# Patient Record
Sex: Female | Born: 1965 | Race: White | Hispanic: No | Marital: Married | Smoking: Never smoker
Health system: Southern US, Community
[De-identification: ages and names within clinical notes are randomized; demographics above are authoritative.]

## PROBLEM LIST (undated history)

## (undated) ENCOUNTER — Other Ambulatory Visit (HOSPITAL_COMMUNITY): Admission: EM | Payer: Self-pay

## (undated) DIAGNOSIS — F32A Depression, unspecified: Secondary | ICD-10-CM

## (undated) DIAGNOSIS — F419 Anxiety disorder, unspecified: Secondary | ICD-10-CM

## (undated) DIAGNOSIS — I10 Essential (primary) hypertension: Secondary | ICD-10-CM

## (undated) DIAGNOSIS — E039 Hypothyroidism, unspecified: Secondary | ICD-10-CM

## (undated) DIAGNOSIS — K219 Gastro-esophageal reflux disease without esophagitis: Secondary | ICD-10-CM

---

## 2021-04-07 ENCOUNTER — Encounter (HOSPITAL_COMMUNITY): Payer: Self-pay | Admitting: Emergency Medicine

## 2021-04-07 ENCOUNTER — Other Ambulatory Visit: Payer: Self-pay

## 2021-04-07 ENCOUNTER — Inpatient Hospital Stay (HOSPITAL_COMMUNITY)
Admission: EM | Admit: 2021-04-07 | Discharge: 2021-04-10 | DRG: 918 | Disposition: A | Discharge status: Other Acute Inpatient Hospital | Payer: BC Managed Care – PPO | Attending: Internal Medicine | Admitting: Internal Medicine

## 2021-04-07 ENCOUNTER — Ambulatory Visit (HOSPITAL_COMMUNITY)
Admission: RE | Admit: 2021-04-07 | Discharge: 2021-04-07 | Disposition: A | Discharge status: Home / Self Care | Payer: BC Managed Care – PPO | Attending: Psychiatry | Admitting: Psychiatry

## 2021-04-07 DIAGNOSIS — E039 Hypothyroidism, unspecified: Secondary | ICD-10-CM | POA: Diagnosis present

## 2021-04-07 DIAGNOSIS — I10 Essential (primary) hypertension: Secondary | ICD-10-CM | POA: Diagnosis present

## 2021-04-07 DIAGNOSIS — F1020 Alcohol dependence, uncomplicated: Secondary | ICD-10-CM

## 2021-04-07 DIAGNOSIS — Z79890 Hormone replacement therapy (postmenopausal): Secondary | ICD-10-CM

## 2021-04-07 DIAGNOSIS — E785 Hyperlipidemia, unspecified: Secondary | ICD-10-CM | POA: Diagnosis present

## 2021-04-07 DIAGNOSIS — T50901A Poisoning by unspecified drugs, medicaments and biological substances, accidental (unintentional), initial encounter: Secondary | ICD-10-CM | POA: Diagnosis present

## 2021-04-07 DIAGNOSIS — K219 Gastro-esophageal reflux disease without esophagitis: Secondary | ICD-10-CM | POA: Diagnosis present

## 2021-04-07 DIAGNOSIS — R45851 Suicidal ideations: Secondary | ICD-10-CM | POA: Diagnosis not present

## 2021-04-07 DIAGNOSIS — F1010 Alcohol abuse, uncomplicated: Secondary | ICD-10-CM | POA: Diagnosis present

## 2021-04-07 DIAGNOSIS — F419 Anxiety disorder, unspecified: Secondary | ICD-10-CM | POA: Diagnosis present

## 2021-04-07 DIAGNOSIS — Z9151 Personal history of suicidal behavior: Secondary | ICD-10-CM

## 2021-04-07 DIAGNOSIS — E876 Hypokalemia: Secondary | ICD-10-CM | POA: Diagnosis present

## 2021-04-07 DIAGNOSIS — F10920 Alcohol use, unspecified with intoxication, uncomplicated: Secondary | ICD-10-CM

## 2021-04-07 DIAGNOSIS — T6592XA Toxic effect of unspecified substance, intentional self-harm, initial encounter: Secondary | ICD-10-CM

## 2021-04-07 DIAGNOSIS — T6591XA Toxic effect of unspecified substance, accidental (unintentional), initial encounter: Secondary | ICD-10-CM

## 2021-04-07 DIAGNOSIS — Z79899 Other long term (current) drug therapy: Secondary | ICD-10-CM

## 2021-04-07 DIAGNOSIS — T502X5A Adverse effect of carbonic-anhydrase inhibitors, benzothiadiazides and other diuretics, initial encounter: Secondary | ICD-10-CM | POA: Diagnosis present

## 2021-04-07 DIAGNOSIS — F10220 Alcohol dependence with intoxication, uncomplicated: Secondary | ICD-10-CM | POA: Diagnosis present

## 2021-04-07 DIAGNOSIS — R9431 Abnormal electrocardiogram [ECG] [EKG]: Secondary | ICD-10-CM

## 2021-04-07 DIAGNOSIS — T39312A Poisoning by propionic acid derivatives, intentional self-harm, initial encounter: Secondary | ICD-10-CM | POA: Diagnosis not present

## 2021-04-07 DIAGNOSIS — F322 Major depressive disorder, single episode, severe without psychotic features: Secondary | ICD-10-CM

## 2021-04-07 DIAGNOSIS — Z20822 Contact with and (suspected) exposure to covid-19: Secondary | ICD-10-CM | POA: Diagnosis present

## 2021-04-07 HISTORY — DX: Essential (primary) hypertension: I10

## 2021-04-07 HISTORY — DX: Gastro-esophageal reflux disease without esophagitis: K21.9

## 2021-04-07 HISTORY — DX: Depression, unspecified: F32.A

## 2021-04-07 HISTORY — DX: Anxiety disorder, unspecified: F41.9

## 2021-04-07 LAB — BASIC METABOLIC PANEL
Anion gap: 12 (ref 5–15)
BUN: 12 mg/dL (ref 6–20)
CO2: 22 mmol/L (ref 22–32)
Calcium: 9.5 mg/dL (ref 8.9–10.3)
Chloride: 106 mmol/L (ref 98–111)
Creatinine, Ser: 0.82 mg/dL (ref 0.44–1.00)
GFR, Estimated: >60 mL/min (ref >60)
Glucose, Bld: 142 mg/dL — ABNORMAL HIGH (ref 70–99)
Potassium: 3 mmol/L — ABNORMAL LOW (ref 3.5–5.1)
Sodium: 140 mmol/L (ref 135–145)

## 2021-04-07 LAB — CBC WITH DIFFERENTIAL/PLATELET
Abs Immature Granulocytes: 0.01 10*3/uL (ref 0.00–0.07)
Basophils Absolute: 0 10*3/uL (ref 0.0–0.1)
Basophils Relative: 1 %
Eosinophils Absolute: 0.1 10*3/uL (ref 0.0–0.5)
Eosinophils Relative: 1 %
HCT: 39 % (ref 36.0–46.0)
Hemoglobin: 13.2 g/dL (ref 12.0–15.0)
Immature Granulocytes: 0 %
Lymphocytes Relative: 40 %
Lymphs Abs: 2.7 10*3/uL (ref 0.7–4.0)
MCH: 33.4 pg (ref 26.0–34.0)
MCHC: 33.8 g/dL (ref 30.0–36.0)
MCV: 98.7 fL (ref 80.0–100.0)
Monocytes Absolute: 0.6 10*3/uL (ref 0.1–1.0)
Monocytes Relative: 9 %
Neutro Abs: 3.4 10*3/uL (ref 1.7–7.7)
Neutrophils Relative %: 49 %
Platelets: 232 10*3/uL (ref 150–400)
RBC: 3.95 MIL/uL (ref 3.87–5.11)
RDW: 12.3 % (ref 11.5–15.5)
WBC: 6.9 10*3/uL (ref 4.0–10.5)
nRBC: 0 % (ref 0.0–0.2)

## 2021-04-07 LAB — COMPREHENSIVE METABOLIC PANEL
ALT: 45 U/L — ABNORMAL HIGH (ref 0–44)
AST: 49 U/L — ABNORMAL HIGH (ref 15–41)
Albumin: 3.6 g/dL (ref 3.5–5.0)
Alkaline Phosphatase: 39 U/L (ref 38–126)
Anion gap: 13 (ref 5–15)
BUN: 13 mg/dL (ref 6–20)
CO2: 23 mmol/L (ref 22–32)
Calcium: 9.5 mg/dL (ref 8.9–10.3)
Chloride: 103 mmol/L (ref 98–111)
Creatinine, Ser: 0.92 mg/dL (ref 0.44–1.00)
GFR, Estimated: >60 mL/min (ref >60)
Glucose, Bld: 84 mg/dL (ref 70–99)
Potassium: 2.8 mmol/L — ABNORMAL LOW (ref 3.5–5.1)
Sodium: 139 mmol/L (ref 135–145)
Total Bilirubin: 0.6 mg/dL (ref 0.3–1.2)
Total Protein: 6.2 g/dL — ABNORMAL LOW (ref 6.5–8.1)

## 2021-04-07 LAB — RAPID URINE DRUG SCREEN, HOSP PERFORMED
Amphetamines: NOT DETECTED
Barbiturates: POSITIVE — AB
Benzodiazepines: NOT DETECTED
Cocaine: NOT DETECTED
Opiates: NOT DETECTED
Tetrahydrocannabinol: NOT DETECTED

## 2021-04-07 LAB — ETHANOL: Alcohol, Ethyl (B): 184 mg/dL — ABNORMAL HIGH (ref <10)

## 2021-04-07 LAB — SALICYLATE LEVEL: Salicylate Lvl: <7 mg/dL — ABNORMAL LOW (ref 7.0–30.0)

## 2021-04-07 LAB — ACETAMINOPHEN LEVEL
Acetaminophen (Tylenol), Serum: <10 ug/mL — ABNORMAL LOW (ref 10–30)
Acetaminophen (Tylenol), Serum: <10 ug/mL — ABNORMAL LOW (ref 10–30)

## 2021-04-07 LAB — RESP PANEL BY RT-PCR (FLU A&B, COVID) ARPGX2
Influenza A by PCR: NEGATIVE
Influenza B by PCR: NEGATIVE
SARS Coronavirus 2 by RT PCR: NEGATIVE

## 2021-04-07 LAB — I-STAT BETA HCG BLOOD, ED (MC, WL, AP ONLY): I-stat hCG, quantitative: <5 m[IU]/mL (ref <5)

## 2021-04-07 MED ORDER — LORAZEPAM 1 MG PO TABS
0.0000 mg | ORAL_TABLET | Freq: Two times a day (BID) | ORAL | Status: DC
Start: 2021-04-10 — End: 2021-04-08

## 2021-04-07 MED ORDER — POTASSIUM CHLORIDE 10 MEQ/100ML IV SOLN
10.0000 meq | Freq: Once | INTRAVENOUS | Status: AC
  Administered 2021-04-07: 10 meq via INTRAVENOUS
  Filled 2021-04-07: qty 100

## 2021-04-07 MED ORDER — LORAZEPAM 2 MG/ML IJ SOLN
0.0000 mg | Freq: Four times a day (QID) | INTRAMUSCULAR | Status: DC
  Administered 2021-04-07: 2 mg via INTRAVENOUS
  Filled 2021-04-07: qty 1

## 2021-04-07 MED ORDER — ACETAMINOPHEN 500 MG PO TABS
1000.0000 mg | ORAL_TABLET | Freq: Once | ORAL | Status: DC
  Filled 2021-04-07: qty 2

## 2021-04-07 MED ORDER — LORAZEPAM 1 MG PO TABS
0.0000 mg | ORAL_TABLET | Freq: Four times a day (QID) | ORAL | Status: DC
Start: 2021-04-07 — End: 2021-04-08

## 2021-04-07 MED ORDER — THIAMINE HCL 100 MG/ML IJ SOLN: 100.0000 mg | Freq: Every day | INTRAMUSCULAR | Status: DC

## 2021-04-07 MED ORDER — LORAZEPAM 2 MG/ML IJ SOLN: 0.0000 mg | Freq: Two times a day (BID) | INTRAMUSCULAR | Status: DC

## 2021-04-07 MED ORDER — POTASSIUM CHLORIDE CRYS ER 20 MEQ PO TBCR
40.0000 meq | EXTENDED_RELEASE_TABLET | Freq: Once | ORAL | Status: AC
  Administered 2021-04-07: 40 meq via ORAL
  Filled 2021-04-07: qty 2

## 2021-04-07 MED ORDER — THIAMINE HCL 100 MG PO TABS
100.0000 mg | ORAL_TABLET | Freq: Every day | ORAL | Status: DC
  Administered 2021-04-07: 100 mg via ORAL
  Filled 2021-04-07: qty 1

## 2021-04-07 NOTE — ED Provider Notes (Signed)
[facility] EMERGENCY DEPARTMENT Provider Note   CSN: [id number] Arrival date & time: 04/07/21  1633     History Chief Complaint  Patient presents with   Ingestion    Victoria Patel is a 55 y.o. female who presents for evaluation of alcohol intoxication, overdose.  Patient reports that she recently moved here from [place].  She states that she thought a fresh start would help her and that everything would be better but she reports that since being here, she has had several stressful situations and have contributed to her ongoing depression.  She reports that she has thought about wanting to hurt or kill herself for a few weeks.  She endorses some family issues, feeling like she is isolated and does not know anybody here, difficulties with her pet.  She reports that she has history of alcoholism and she has done rehab before.  She estimates that she drinks about a pint of liquor a day.  She reports drinking 3 many [employer] bottles of vodka earlier today.  She reports that she took a handful of Advil PM earlier this afternoon because "I just wanted to sleep and maybe not wake up."  She then took another handful.  Is unclear exactly the timeframe of which she did this.  Her husband initially took her to behavioral health urgent care but she was transferred to the ER for further evaluation when they discovered that she had taken Advil.  She denies any other drug use, cocaine, marijuana, heroin.  She denies any auditory visual hallucinations.  No HI.  EM LEVEL 5 CAVEAT DUE TO PSYCH  The history is provided by the patient.      Past Medical History:  Diagnosis Date   Anxiety    Depression    GERD (gastroesophageal reflux disease)    HTN (hypertension)     There are no problems to display for this patient.    The histories are not reviewed yet. Please review them in the "History" navigator section and refresh this SmartLink.   OB History   No obstetric history on file.      No family history on file.  Social History   Tobacco Use   Smoking status: Never   Smokeless tobacco: Never  Substance Use Topics   Alcohol use: Yes    Comment: pint a day   Drug use: Not Currently    Home Medications Prior to Admission medications   Medication Sig Start Date End Date Taking? Authorizing Provider  amLODipine (NORVASC) 10 MG tablet Take 10 mg by mouth daily. 03/20/21  Yes [provider]  atenolol (TENORMIN) 50 MG tablet Take 50 mg by mouth daily. 03/28/21  Yes [provider]  diphenhydramine-acetaminophen (TYLENOL PM) 25-500 MG TABS tablet Take 2 tablets by mouth at bedtime.   Yes [provider]  disulfiram (ANTABUSE) 250 MG tablet Take 500 mg by mouth every morning. 03/30/21  Yes [provider]  escitalopram (LEXAPRO) 20 MG tablet Take 20 mg by mouth daily. 03/28/21  Yes [provider]  Esomeprazole Magnesium (NEXIUM 24HR) 20 MG TBEC Take 20 mg by mouth daily.   Yes [provider]  levothyroxine (SYNTHROID) 75 MCG tablet Take 75 mcg by mouth every morning. 03/21/21  Yes [provider]  lisinopril-hydrochlorothiazide (ZESTORETIC) 20-12.5 MG tablet Take 2 tablets by mouth daily. 03/28/21  Yes [provider]  OVER THE COUNTER MEDICATION Place 1 drop into both eyes daily as needed (dry eyes). Dry eye drop  Yes [provider]  rosuvastatin (CRESTOR) 40 MG tablet Take 40 mg by mouth daily. 03/20/21  Yes [provider]    Allergies    Patient has no known allergies.  Review of Systems   Review of Systems  Unable to perform ROS: Psychiatric disorder   Physical Exam Updated Vital Signs BP (!) 175/109   Pulse 68   Temp 98.5 F (36.9 C) (Oral)   Resp 15   Ht 5\' 8"  (1.727 m)   Wt 89.4 kg   SpO2 96%   BMI 29.95 kg/m   Physical Exam Vitals and nursing note reviewed.  Constitutional:      Appearance: Normal appearance. She is well-developed.     Comments: Appears  intoxicated.  HENT:     Head: Normocephalic and atraumatic.  Eyes:     General: Lids are normal.     Conjunctiva/sclera: Conjunctivae normal.     Pupils: Pupils are equal, round, and reactive to light.  Cardiovascular:     Rate and Rhythm: Normal rate and regular rhythm.     Pulses: Normal pulses.     Heart sounds: Normal heart sounds. No murmur heard.   No friction rub. No gallop.  Pulmonary:     Effort: Pulmonary effort is normal.     Breath sounds: Normal breath sounds.     Comments: Lungs clear to auscultation bilaterally.  Symmetric chest rise.  No wheezing, rales, rhonchi.  Abdominal:     Palpations: Abdomen is soft. Abdomen is not rigid.     Tenderness: There is no abdominal tenderness. There is no guarding.     Comments: Abdomen is soft, non-distended, non-tender. No rigidity, No guarding. No peritoneal signs.   Musculoskeletal:        General: Normal range of motion.     Cervical back: Full passive range of motion without pain.  Skin:    General: Skin is warm and dry.     Capillary Refill: Capillary refill takes less than 2 seconds.  Neurological:     Mental Status: She is alert and oriented to person, place, and time.  Psychiatric:        Speech: Speech normal.        Thought Content: Thought content includes suicidal ideation. Thought content does not include homicidal ideation. Thought content includes suicidal plan. Thought content does not include homicidal plan.     Comments: Tearful    ED Results / Procedures / Treatments   Labs (all labs ordered are listed, but only abnormal results are displayed) Labs Reviewed  ACETAMINOPHEN LEVEL - Abnormal; Notable for the following components:      Result Value   Acetaminophen (Tylenol), Serum <10 (*)    All other components within normal limits  COMPREHENSIVE METABOLIC PANEL - Abnormal; Notable for the following components:   Potassium 2.8 (*)    Total Protein 6.2 (*)    AST 49 (*)    ALT 45 (*)    All other  components within normal limits  ETHANOL - Abnormal; Notable for the following components:   Alcohol, Ethyl (B) 184 (*)    All other components within normal limits  RAPID URINE DRUG SCREEN, HOSP PERFORMED - Abnormal; Notable for the following components:   Barbiturates POSITIVE (*)    All other components within normal limits  SALICYLATE LEVEL - Abnormal; Notable for the following components:   Salicylate Lvl <7.0 (*)    All other components within normal limits  BASIC METABOLIC PANEL - Abnormal; Notable  for the following components:   Potassium 3.0 (*)    Glucose, Bld 142 (*)    All other components within normal limits  ACETAMINOPHEN LEVEL - Abnormal; Notable for the following components:   Acetaminophen (Tylenol), Serum <10 (*)    All other components within normal limits  RESP PANEL BY RT-PCR (FLU A&B, COVID) ARPGX2  CBC WITH DIFFERENTIAL/PLATELET  MAGNESIUM  I-STAT BETA HCG BLOOD, ED ([facility], [facility], [facility] ONLY)  I-STAT BETA HCG BLOOD, ED ([facility], [facility], [facility] ONLY)    EKG EKG Interpretation  Date/Time:  Friday April 07 2021 16:38:44 EDT Ventricular Rate:  73 PR Interval:  156 QRS Duration: 101 QT Interval:  429 QTC Calculation: 473 R Axis:   34 Text Interpretation: Sinus rhythm Borderline T wave abnormalities No previous tracing Confirmed by Gwyneth Sprout ([id number]) on 04/07/2021 6:32:23 PM    Radiology No results found.  Procedures Procedures   Medications Ordered in ED Medications  LORazepam (ATIVAN) injection 0-4 mg (2 mg Intravenous Given 04/07/21 2320)    Or  LORazepam (ATIVAN) tablet 0-4 mg ( Oral See Alternative 04/07/21 2320)  LORazepam (ATIVAN) injection 0-4 mg (has no administration in time range)    Or  LORazepam (ATIVAN) tablet 0-4 mg (has no administration in time range)  thiamine tablet 100 mg (100 mg Oral Given 04/07/21 1725)    Or  thiamine (B-1) injection 100 mg ( Intravenous See Alternative 04/07/21 1725)  acetaminophen (TYLENOL) tablet 1,000 mg (1,000 mg Oral  Not Given 04/07/21 2208)  potassium chloride 10 mEq in 100 mL IVPB (10 mEq Intravenous New Bag/Given 04/07/21 2325)  potassium chloride 10 mEq in 100 mL IVPB (0 mEq Intravenous Stopped 04/07/21 2015)  potassium chloride SA (KLOR-CON) CR tablet 40 mEq (40 mEq Oral Given 04/07/21 2317)    ED Course  I have reviewed the triage vital signs and the nursing notes.  Pertinent labs & imaging results that were available during my care of the patient were reviewed by me and considered in my medical decision making (see chart for details).    MDM Rules/Calculators/A&P                          55 year old female who presents for evaluation of alcohol intoxication, overdose, suicidal ideation.  Presented to behavioral health urgent care for suicidal ideation.  Reported to them that she had taken Advil PM and she was sent to the ED for medical clearance.  On initial arrival, she is tearful, anxious.  Appears intoxicated.  Vitals are stable.  She tells me that she took the Advil PM because she "wanted to sleep and maybe not wake up."  She does endorse that there are a lot of stressors.  She recently moved to this area and does not feel like she knows anybody.  She does not have a therapist that she follows up with here.  We will plan for medical clearance labs, TTS consultation, poison control consultation.  4:52 PM: I spoke with Angelique Blonder (poison control).  She states the main concern is with the Advil PM and the diphenhydramine.  They recommend watching for tachycardia, agitation.  They do not recommend charcoal at this time.  Patient must be observed for 8 hours.  Since unclear of time of ingestion, will be 8 hours here in the ED.  They recommend an EKG now and repeat in 4 to 6 hours.  They recommend repeat BMP in 6 hours, repeat Tylenol level in 6 hours.  Recommend benzos for agitation.  Recommend supportive care.  COVID-negative.  I-STAT beta negative.  Salicylate level, acetaminophen level are within normal  limits.  Ethanol is 184.  CMP shows potassium of 2.8.  BUN and creatinine within normal limits.  LFTs are mildly elevated.  CBC shows no leukocytosis.  UDS is positive for barbiturates.  I called and updated husband on ED course and plan. He is agreeable.   Repeat BMP showed Potassium of 3.0. BUN/Cr within normal limits. Repeat tylenol level is normal.   11:01 PM: Discussed with Poison Control. They recommend continued repletion of potassium and repeat EKG 4 hrs to check QRS and QTC. They would like QRS > 120 and QTC > 500. They also recommend checking a magnesium.   Patient signed out to oncoming PA with repeat EKG pending. If QRS and QTC are abnormal, than patient will observation for continued medical monitoring. [facility]   Updated patient on plan. Patient is voluntary at this time.   Portions of this note were generated with Scientist, clinical (histocompatibility and immunogenetics)Dragon dictation software. Dictation errors may occur despite best attempts at proofreading.  Final Clinical Impression(s) / ED Diagnoses Final diagnoses:  Suicidal ideation  Ingestion of substance, intentional self-harm, initial encounter (HCC)  Alcoholic intoxication without complication (HCC)  Hypokalemia    Rx / DC Orders ED Discharge Orders     None        Rosana HoesLayden, Lio Wehrly A, PA-C 04/08/21 0003    Gwyneth SproutPlunkett, Whitney, MD 04/09/21 1611

## 2021-04-07 NOTE — ED Notes (Signed)
Pt changed into purple scrubs. Personal belongings taken. Security called for wand

## 2021-04-07 NOTE — ED Notes (Signed)
Pt changed into scrubs and wanded by security. Belongings collected and inventoried. Pt resting in bed.

## 2021-04-07 NOTE — H&P (Signed)
Behavioral Health Medical Screening Exam  Victoria Patel is an 55 y.o. female who presented to [facility] voluntarily as a walk-in with her husband for assessment. During triage [facility] noted smell of alcohol on patient who then reported having 1/2 liter, 2 "mini" and 14 Advil prior to arrival. Emergency services were dispatched.   On assessment patient crying; verbalized the act was an attempt to end her life. Husband reports this isn't patient's first attempt and has history of psychiatric hospitalization. He states the couple just moved to the area from [place], [place] about 3 weeks ago; directed provider to patient's wrist where 3 horizontal superficial abrasions were noted. Patient's v/s 149/102, 79, 23, 100% room air. Heart and lungs clear on auscultation. Breathing effort normal. Patient transported to ED for medical clearance.   Total Time spent with patient: 20 minutes  Psychiatric Specialty Exam: Physical Exam Vitals and nursing note reviewed.  Constitutional:      Appearance: Normal appearance. She is diaphoretic.     Comments: Patient admitted to intentionally overdosing on Advil and ETOH; 911 called to transport patient to closest ED for medication stabilization and treatment.   HENT:     Head: Normocephalic.     Nose: Nose normal.     Mouth/Throat:     Mouth: Mucous membranes are dry.     Pharynx: Oropharynx is clear.  Cardiovascular:     Rate and Rhythm: Normal rate.     Pulses: Normal pulses.  Pulmonary:     Effort: Pulmonary effort is normal.  Musculoskeletal:        General: Normal range of motion.     Cervical back: Normal range of motion.  Skin:    General: Skin is warm.  Neurological:     General: No focal deficit present.     Mental Status: She is alert and oriented to person, place, and time.  Psychiatric:        Attention and Perception: She is inattentive.        Mood and Affect: Affect is tearful.        Behavior: Behavior is agitated.        Thought  Content: Thought content includes suicidal ideation.        Cognition and Memory: Cognition and memory normal.        Judgment: Judgment is impulsive and inappropriate.   Review of Systems  Skin:        X2 horizontal superficial self-inflicted abrasions on b/l wrists  Psychiatric/Behavioral:  Positive for dysphoric mood, self-injury and suicidal ideas.   All other systems reviewed and are negative. Blood pressure (!) 149/102, pulse 79, resp. rate 18, SpO2 100 %.There is no height or weight on file to calculate BMI. General Appearance: Disheveled Eye Contact:   fleeting Speech:  Pressured Volume:  Normal Mood:  Anxious and Depressed Affect:  Labile and Tearful Thought Process:  Irrelevant Orientation:  Other:  unable to fully assess due to patient's current state Thought Content:  Illogical Suicidal Thoughts:  Yes.  with intent/plan Homicidal Thoughts:  No Memory:  Other:  unable to fully assess due to patient's current state Judgement:  Poor Insight:  Other:  unable to fully assess due to patient's current state Psychomotor Activity:  Other:  unable to fully assess due to patient's current state Concentration: Concentration: Poor and Attention Span: Poor Recall:  Other:  unable to fully assess due to patient's current state Fund of Knowledge:Other:  unable to fully assess due to patient's current state Language:  Fair Akathisia:  NA Handed:   AIMS (if indicated):    Assets:  Intimacy Social Support Sleep:     Musculoskeletal: Strength & Muscle Tone: decreased Gait & Station: unsteady Patient leans: Backward  Blood pressure (!) 149/102, pulse 79, resp. rate 18, SpO2 100 %.  Recommendations: Based on my evaluation the patient appears to have an emergency medical condition for which I recommend the patient be transferred to the emergency department for further evaluation.   Loletta Parish, NP 04/07/2021, 5:56 PM

## 2021-04-07 NOTE — BH Assessment (Addendum)
Comprehensive Clinical Assessment (CCA) Note  04/07/2021 Leanna Hamid [id number] Disposition: Clinician discussed patient care with Roselyn Bering, NP.  She recommended inpatient psychiatric care.  Clinician spoke with nurse Jon Gills and informed her of disposition. Flowsheet Row ED from 04/07/2021 in [facility] EMERGENCY DEPARTMENT  C-SSRS RISK CATEGORY High Risk      The patient demonstrates the following risk factors for suicide: Chronic risk factors for suicide include: psychiatric disorder of MDD recurrent, severe , substance use disorder, and previous suicide attempts multiple . Acute risk factors for suicide include: social withdrawal/isolation and pet is dying . Protective factors for this patient include: positive social support and coping skills. Considering these factors, the overall suicide risk at this point appears to be high. Patient is not appropriate for outpatient follow up.  Pt is tearful during assessment.  She is oriented x4 and has fleeting eye contact.  Pt is not responding to internal stimuli nor does she evidence any delusional thought process.  Pt reports needing either ETOH or a sleep aid to sleep through the night.    Chief Complaint:  Chief Complaint  Patient presents with   Ingestion   Visit Diagnosis: MDD recurrent, severe; ETOH use d/o severe    CCA Screening, Triage and Referral (STR)  Patient Reported Information How did you hear about [place]? Family/Friend (Pt's husband had transported her to [facility] today.)  What Is the Reason for Your Visit/Call Today? Pt's husband had brought her over to [facility] and she was seen by NP Lanier Prude.  Pt says she was brought to [facility] becasue of her drinking.  Pt admits that there were suicidal thoughts today.  Pt took 14 tablets of Advil PM "to go to sleep and not wake up."  Pt says that she thinks about suicide 'all the time" and has had previous attempts.  Pt says her drinking is causing her to feel like she  wants to die.  Pt has been to a facility called [facility] in [place].  That was 2 years ago.  She has no thoughts of harming anyone else.  No A/V hallucinations.  Pt will drink about a pint of liquor daily.  She said that "If I buy it I drink it."  Pt is depressed about her cat being in the proces of dying of kidney failure.  Pt and husband moved from [place] to [place] about a month ago.  How Long Has This Been Causing You Problems? > than 6 months  What Do You Feel Would Help You the Most Today? Alcohol or Drug Use Treatment; Treatment for Depression or other mood problem   Have You Recently Had Any Thoughts About Hurting Yourself? Yes  Are You Planning to Commit Suicide/Harm Yourself At This time? Yes (Pt denies currently.)   Have you Recently Had Thoughts About Hurting Someone Karolee Ohs? No  Are You Planning to Harm Someone at This Time? No  Explanation: No data recorded  Have You Used Any Alcohol or Drugs in the Past 24 Hours? Yes  How Long Ago Did You Use Drugs or Alcohol? No data recorded What Did You Use and How Much? Pt drank two mini bottles today and last night drank "more than a pint."   Do You Currently Have a Therapist/Psychiatrist? No  Name of Therapist/Psychiatrist: No data recorded  Have You Been Recently Discharged From Any Office Practice or Programs? No  Explanation of Discharge From Practice/Program: No data recorded    CCA Screening Triage Referral Assessment Type of Contact:  Face-to-Face  Telemedicine Service Delivery:   Is this Initial or Reassessment? No data recorded Date Telepsych consult ordered in [facility]:  No data recorded Time Telepsych consult ordered in [facility]:  No data recorded Location of Assessment: [facility] ED  Provider Location: Other (comment) (Clinician at [facility].)   Collateral Involvement: No data recorded  Does Patient Have a Court Appointed Legal Guardian? No data recorded Name and Contact of Legal Guardian: No data recorded If Minor and Not  Living with Parent(s), Who has Custody? No data recorded Is CPS involved or ever been involved? No data recorded Is APS involved or ever been involved? Never   Patient Determined To Be At Risk for Harm To Self or Others Based on Review of Patient Reported Information or Presenting Complaint? Yes, for Self-Harm  Method: No data recorded Availability of Means: No data recorded Intent: No data recorded Notification Required: No data recorded Additional Information for Danger to Others Potential: No data recorded Additional Comments for Danger to Others Potential: No data recorded Are There Guns or Other Weapons in Your Home? No data recorded Types of Guns/Weapons: No data recorded Are These Weapons Safely Secured?                            No data recorded Who Could Verify You Are Able To Have These Secured: No data recorded Do You Have any Outstanding Charges, Pending Court Dates, Parole/Probation? No data recorded Contacted To Inform of Risk of Harm To Self or Others: Family/Significant Other: (Husband is aware.)    Does Patient Present under Involuntary Commitment? No  IVC Papers Initial File Date: No data recorded  [place] of Residence: [place]   Patient Currently Receiving the Following Services: Not Receiving Services   Determination of Need: Emergent (2 hours)   Options For Referral: Inpatient Hospitalization     CCA Biopsychosocial Patient Reported Schizophrenia/Schizoaffective Diagnosis in Past: No   Strengths: Family oriented.   Mental Health Symptoms Depression:   Change in energy/activity; Hopelessness; Worthlessness; Tearfulness   Duration of Depressive symptoms:  Duration of Depressive Symptoms: Greater than two weeks   Mania:   None   Anxiety:    Tension; Worrying; Difficulty concentrating   Psychosis:   None   Duration of Psychotic symptoms:    Trauma:   Emotional numbing   Obsessions:   None   Compulsions:   None   Inattention:    None   Hyperactivity/Impulsivity:   None   Oppositional/Defiant Behaviors:   None   Emotional Irregularity:   Chronic feelings of emptiness; Unstable self-image   Other Mood/Personality Symptoms:  No data recorded   Mental Status Exam Appearance and self-care  Stature:   Average   Weight:   Average weight   Clothing:   Casual   Grooming:   Normal   Cosmetic use:   None   Posture/gait:  No data recorded  Motor activity:   Not Remarkable   Sensorium  Attention:   Normal   Concentration:   Normal   Orientation:   X5   Recall/memory:   Normal   Affect and Mood  Affect:   Depressed   Mood:   Depressed   Relating  Eye contact:   Normal   Facial expression:   Depressed; Sad   Attitude toward examiner:   Cooperative   Thought and Language  Speech flow:  Clear and Coherent   Thought content:   Appropriate to Mood and Circumstances  Preoccupation:  No data recorded  Hallucinations:   None   Organization:  No data recorded  Affiliated Computer ServicesExecutive Functions  Fund of Knowledge:   Average   Intelligence:   Average   Abstraction:   Normal   Judgement:   Poor   Reality Testing:   Realistic   Insight:   Good   Decision Making:   Normal   Social Functioning  Social Maturity:   Impulsive   Social Judgement:   Normal   Stress  Stressors:   Grief/losses (Recent move and her cat is dying.)   Coping Ability:   Human resources officerverwhelmed   Skill Deficits:   Decision making   Supports:   Family     Religion:    Leisure/Recreation:    Exercise/Diet: Exercise/Diet Have You Gained or Lost A Significant Amount of Weight in the Past Six Months?: No Do You Have Any Trouble Sleeping?: Yes Explanation of Sleeping Difficulties: Less than 4 hours unless she takes something or drinks.   CCA Employment/Education Employment/Work Situation: Employment / Work Systems developerituation Employment Situation: Unemployed Has Patient ever Been in Equities traderthe Military?:  No  Education: Education Is Patient Currently Attending School?: No Did Theme park managerYou Attend College?: No   CCA Family/Childhood History Family and Relationship History: Family history Marital status: Married Number of Years Married: 5 Does patient have children?: Yes How many children?: 2  Childhood History:  Childhood History By whom was/is the patient raised?: Foster parents, Mother Did patient suffer any verbal/emotional/physical/sexual abuse as a child?: No Did patient suffer from severe childhood neglect?: No Has patient ever been sexually abused/assaulted/raped as an adolescent or adult?: No Was the patient ever a victim of a crime or a disaster?: No Witnessed domestic violence?: No Has patient been affected by domestic violence as an adult?: No  Child/Adolescent Assessment:     CCA Substance Use Alcohol/Drug Use: Alcohol / Drug Use Pain Medications: None Prescriptions: See PTA medication list Over the Counter: Advil PM, Nexum History of alcohol / drug use?: Yes Longest period of sobriety (when/how long): 6 years Negative Consequences of Use: Personal relationships Withdrawal Symptoms: Tremors, Patient aware of relationship between substance abuse and physical/medical complications, Tachycardia, Fever / Chills Substance #1 Name of Substance 1: ETOH 1 - Age of First Use: 55 years of age 39 - Amount (size/oz): One pint of liquor 1 - Frequency: Daily 1 - Duration: ongoing 1 - Last Use / Amount: 06/10 Drank about a pint 1 - Method of Aquiring: purchase 1- Route of Use: oral                       ASAM's:  Six Dimensions of Multidimensional Assessment  Dimension 1:  Acute Intoxication and/or Withdrawal Potential:      Dimension 2:  Biomedical Conditions and Complications:      Dimension 3:  Emotional, Behavioral, or Cognitive Conditions and Complications:     Dimension 4:  Readiness to Change:     Dimension 5:  Relapse, Continued use, or Continued Problem  Potential:     Dimension 6:  Recovery/Living Environment:  Dimension 6:  Recovery/Iiving environment criteria description: Pt's husband does not drink anymore himself.  ASAM Severity Score:    ASAM Recommended Level of Treatment:     Substance use Disorder (SUD) Substance Use Disorder (SUD)  Checklist Symptoms of Substance Use: Presence of craving or strong urge to use, Evidence of tolerance, Evidence of withdrawal (Comment), Recurrent use that results in a failure to fulfill major role obligations (  work, school, home), Persistent desire or unsuccessful efforts to cut down or control use, Social, occupational, recreational activities given up or reduced due to use, Continued use despite having a persistent/recurrent physical/psychological problem caused/exacerbated by use  Recommendations for Services/Supports/Treatments:    Discharge Disposition:    DSM5 Diagnoses: There are no problems to display for this patient.    Referrals to Alternative Service(s): Referred to Alternative Service(s):   Place:   Date:   Time:    Referred to Alternative Service(s):   Place:   Date:   Time:    Referred to Alternative Service(s):   Place:   Date:   Time:    Referred to Alternative Service(s):   Place:   Date:   Time:     Wandra Mannan

## 2021-04-07 NOTE — ED Provider Notes (Signed)
Repeat EKG at 1:30 eval QC, QTc Overdose for SI  Labs ok, low K+ getting supplementation Initially QTc 570  If less than 500, [facility] admission If above 500, admit for obs hypokalemia and prolonged segment  1:45 - Review of repeat EKG shows QRS 96, QTc has worsened, was 585 is now 615  Hospitalist paged for admission. Discussed with Dr. Toniann Fail who accepts for admission.   Poison control updated.  +barbiturates on drug screen - not known to affect EKG changes  In the setting of prolonged QTc goal potassium should be 4.2 and mag 2.2  Monitor for urinary retention.   Elpidio Anis, PA-C 04/08/21 0211    Gwyneth Sprout, MD 04/09/21 [phone]

## 2021-04-07 NOTE — ED Triage Notes (Signed)
Pt arrived to ED via EMS from [facility]. Pt has been drinking ETOH daily a pint. States her husband found her bottle of vodka and threw it away. Pt was mad because she did not want to start withdrawing. Pt took approx 14 advil pm's. States she wants to go to sleep and not wake up because she is tired. Pt was uncooperative for EMS en route

## 2021-04-07 NOTE — ED Notes (Signed)
Husband Kyliee Ortego [phone] would like an update asap

## 2021-04-07 NOTE — Progress Notes (Signed)
Patient ID: Victoria Patel, female   DOB: 15-Mar-1966, 55 y.o.   MRN: [id number] Patient walked in to [facility] for an assessment. Called her back to the assessment room w/husband present. Per patient and husband collaboration, she drank 1/2 litre vodka within the past 24 hours. During the past hour, she has consumed 2 mini bottled and took (per patient) approximately 14 advil pm tablets. Called EMS promptly because it is determined patient intentionally overdosed on alcohol and pills. She is extremely unsteady on her feet and smells strongly of alcohol. Patient became very upset when told she had to go to the hospital to be medically cleared. Husband took her belongings and purse. Husband states that when she gets like this, "she will try and do anything to hurt herself." Patient did specify that she is currently suicidal and wants to harm herself. Patient is to go to [facility] due to [facility] on diversion per EMS.

## 2021-04-08 ENCOUNTER — Encounter (HOSPITAL_COMMUNITY): Payer: Self-pay | Admitting: Internal Medicine

## 2021-04-08 DIAGNOSIS — F322 Major depressive disorder, single episode, severe without psychotic features: Secondary | ICD-10-CM

## 2021-04-08 DIAGNOSIS — T502X5A Adverse effect of carbonic-anhydrase inhibitors, benzothiadiazides and other diuretics, initial encounter: Secondary | ICD-10-CM | POA: Diagnosis not present

## 2021-04-08 DIAGNOSIS — E039 Hypothyroidism, unspecified: Secondary | ICD-10-CM | POA: Diagnosis not present

## 2021-04-08 DIAGNOSIS — F419 Anxiety disorder, unspecified: Secondary | ICD-10-CM | POA: Diagnosis not present

## 2021-04-08 DIAGNOSIS — Z9151 Personal history of suicidal behavior: Secondary | ICD-10-CM | POA: Diagnosis not present

## 2021-04-08 DIAGNOSIS — T50902A Poisoning by unspecified drugs, medicaments and biological substances, intentional self-harm, initial encounter: Secondary | ICD-10-CM

## 2021-04-08 DIAGNOSIS — F1010 Alcohol abuse, uncomplicated: Secondary | ICD-10-CM | POA: Diagnosis present

## 2021-04-08 DIAGNOSIS — R9431 Abnormal electrocardiogram [ECG] [EKG]: Secondary | ICD-10-CM | POA: Diagnosis not present

## 2021-04-08 DIAGNOSIS — R45851 Suicidal ideations: Secondary | ICD-10-CM

## 2021-04-08 DIAGNOSIS — K219 Gastro-esophageal reflux disease without esophagitis: Secondary | ICD-10-CM | POA: Diagnosis not present

## 2021-04-08 DIAGNOSIS — F1020 Alcohol dependence, uncomplicated: Secondary | ICD-10-CM

## 2021-04-08 DIAGNOSIS — Z20822 Contact with and (suspected) exposure to covid-19: Secondary | ICD-10-CM | POA: Diagnosis not present

## 2021-04-08 DIAGNOSIS — I10 Essential (primary) hypertension: Secondary | ICD-10-CM | POA: Diagnosis not present

## 2021-04-08 DIAGNOSIS — F10220 Alcohol dependence with intoxication, uncomplicated: Secondary | ICD-10-CM | POA: Diagnosis not present

## 2021-04-08 DIAGNOSIS — E876 Hypokalemia: Secondary | ICD-10-CM | POA: Diagnosis present

## 2021-04-08 DIAGNOSIS — T50902D Poisoning by unspecified drugs, medicaments and biological substances, intentional self-harm, subsequent encounter: Secondary | ICD-10-CM | POA: Diagnosis not present

## 2021-04-08 DIAGNOSIS — T50901A Poisoning by unspecified drugs, medicaments and biological substances, accidental (unintentional), initial encounter: Secondary | ICD-10-CM | POA: Diagnosis present

## 2021-04-08 DIAGNOSIS — T39312A Poisoning by propionic acid derivatives, intentional self-harm, initial encounter: Secondary | ICD-10-CM | POA: Diagnosis not present

## 2021-04-08 DIAGNOSIS — E785 Hyperlipidemia, unspecified: Secondary | ICD-10-CM | POA: Diagnosis not present

## 2021-04-08 DIAGNOSIS — F10920 Alcohol use, unspecified with intoxication, uncomplicated: Secondary | ICD-10-CM | POA: Insufficient documentation

## 2021-04-08 DIAGNOSIS — Z79890 Hormone replacement therapy (postmenopausal): Secondary | ICD-10-CM | POA: Diagnosis not present

## 2021-04-08 DIAGNOSIS — Z79899 Other long term (current) drug therapy: Secondary | ICD-10-CM | POA: Diagnosis not present

## 2021-04-08 LAB — BASIC METABOLIC PANEL
Anion gap: 9 (ref 5–15)
BUN: 11 mg/dL (ref 6–20)
CO2: 27 mmol/L (ref 22–32)
Calcium: 9.5 mg/dL (ref 8.9–10.3)
Chloride: 104 mmol/L (ref 98–111)
Creatinine, Ser: 0.75 mg/dL (ref 0.44–1.00)
GFR, Estimated: >60 mL/min (ref >60)
Glucose, Bld: 87 mg/dL (ref 70–99)
Potassium: 3.6 mmol/L (ref 3.5–5.1)
Sodium: 140 mmol/L (ref 135–145)

## 2021-04-08 LAB — HIV ANTIBODY (ROUTINE TESTING W REFLEX): HIV Screen 4th Generation wRfx: NONREACTIVE

## 2021-04-08 LAB — MAGNESIUM
Magnesium: 1.5 mg/dL — ABNORMAL LOW (ref 1.7–2.4)
Magnesium: 2.1 mg/dL (ref 1.7–2.4)

## 2021-04-08 MED ORDER — HYDRALAZINE HCL 20 MG/ML IJ SOLN
5.0000 mg | INTRAMUSCULAR | Status: DC | PRN
  Administered 2021-04-09 (×2): 5 mg via INTRAVENOUS
  Filled 2021-04-08 (×2): qty 1

## 2021-04-08 MED ORDER — GABAPENTIN 100 MG PO CAPS
100.0000 mg | ORAL_CAPSULE | Freq: Two times a day (BID) | ORAL | Status: DC
Start: 2021-04-08 — End: 2021-04-10
  Administered 2021-04-08 – 2021-04-10 (×5): 100 mg via ORAL
  Filled 2021-04-08 (×6): qty 1

## 2021-04-08 MED ORDER — ATENOLOL 50 MG PO TABS
50.0000 mg | ORAL_TABLET | Freq: Every day | ORAL | Status: DC
  Administered 2021-04-08 – 2021-04-10 (×3): 50 mg via ORAL
  Filled 2021-04-08 (×3): qty 1

## 2021-04-08 MED ORDER — POTASSIUM CHLORIDE CRYS ER 20 MEQ PO TBCR
20.0000 meq | EXTENDED_RELEASE_TABLET | Freq: Once | ORAL | Status: AC
  Administered 2021-04-08: 20 meq via ORAL
  Filled 2021-04-08: qty 1

## 2021-04-08 MED ORDER — THIAMINE HCL 100 MG/ML IJ SOLN
100.0000 mg | Freq: Every day | INTRAMUSCULAR | Status: DC
  Filled 2021-04-08: qty 2

## 2021-04-08 MED ORDER — FOLIC ACID 1 MG PO TABS
1.0000 mg | ORAL_TABLET | Freq: Every day | ORAL | Status: DC
  Administered 2021-04-08 – 2021-04-10 (×3): 1 mg via ORAL
  Filled 2021-04-08 (×3): qty 1

## 2021-04-08 MED ORDER — ALUM & MAG HYDROXIDE-SIMETH 200-200-20 MG/5ML PO SUSP
30.0000 mL | Freq: Once | ORAL | Status: AC
  Administered 2021-04-08: 30 mL via ORAL
  Filled 2021-04-08: qty 30

## 2021-04-08 MED ORDER — LIDOCAINE VISCOUS HCL 2 % MT SOLN
15.0000 mL | Freq: Once | OROMUCOSAL | Status: AC
  Administered 2021-04-08: 15 mL via ORAL
  Filled 2021-04-08: qty 15

## 2021-04-08 MED ORDER — FLUOXETINE HCL 20 MG PO CAPS
20.0000 mg | ORAL_CAPSULE | Freq: Every day | ORAL | Status: DC
  Administered 2021-04-08 – 2021-04-10 (×3): 20 mg via ORAL
  Filled 2021-04-08 (×3): qty 1

## 2021-04-08 MED ORDER — LORAZEPAM 2 MG/ML IJ SOLN
1.0000 mg | INTRAMUSCULAR | Status: DC | PRN
  Administered 2021-04-08: 1 mg via INTRAVENOUS
  Administered 2021-04-08: 2 mg via INTRAVENOUS
  Administered 2021-04-09: 1 mg via INTRAVENOUS
  Filled 2021-04-08 (×3): qty 1

## 2021-04-08 MED ORDER — MAGNESIUM SULFATE 2 GM/50ML IV SOLN
2.0000 g | Freq: Once | INTRAVENOUS | Status: AC
  Administered 2021-04-08: 2 g via INTRAVENOUS
  Filled 2021-04-08: qty 50

## 2021-04-08 MED ORDER — ESCITALOPRAM OXALATE 10 MG PO TABS: 20.0000 mg | ORAL_TABLET | Freq: Every day | ORAL | Status: DC

## 2021-04-08 MED ORDER — LORAZEPAM 1 MG PO TABS: 1.0000 mg | ORAL_TABLET | ORAL | Status: DC | PRN

## 2021-04-08 MED ORDER — LISINOPRIL 40 MG PO TABS
40.0000 mg | ORAL_TABLET | Freq: Every day | ORAL | Status: DC
  Administered 2021-04-09 – 2021-04-10 (×2): 40 mg via ORAL
  Filled 2021-04-08 (×2): qty 1

## 2021-04-08 MED ORDER — LISINOPRIL 20 MG PO TABS
20.0000 mg | ORAL_TABLET | Freq: Once | ORAL | Status: AC
  Administered 2021-04-08: 20 mg via ORAL
  Filled 2021-04-08: qty 1

## 2021-04-08 MED ORDER — AMLODIPINE BESYLATE 10 MG PO TABS
10.0000 mg | ORAL_TABLET | Freq: Every day | ORAL | Status: DC
  Administered 2021-04-08 – 2021-04-10 (×3): 10 mg via ORAL
  Filled 2021-04-08 (×3): qty 1

## 2021-04-08 MED ORDER — LEVOTHYROXINE SODIUM 75 MCG PO TABS
75.0000 ug | ORAL_TABLET | Freq: Every day | ORAL | Status: DC
  Administered 2021-04-08 – 2021-04-10 (×3): 75 ug via ORAL
  Filled 2021-04-08 (×3): qty 1

## 2021-04-08 MED ORDER — THIAMINE HCL 100 MG PO TABS
100.0000 mg | ORAL_TABLET | Freq: Every day | ORAL | Status: DC
  Administered 2021-04-08 – 2021-04-10 (×3): 100 mg via ORAL
  Filled 2021-04-08 (×3): qty 1

## 2021-04-08 MED ORDER — LISINOPRIL 20 MG PO TABS
20.0000 mg | ORAL_TABLET | Freq: Every day | ORAL | Status: DC
  Administered 2021-04-08: 20 mg via ORAL
  Filled 2021-04-08: qty 1

## 2021-04-08 MED ORDER — ADULT MULTIVITAMIN W/MINERALS CH
1.0000 | ORAL_TABLET | Freq: Every day | ORAL | Status: DC
  Administered 2021-04-08 – 2021-04-10 (×3): 1 via ORAL
  Filled 2021-04-08 (×3): qty 1

## 2021-04-08 MED ORDER — ROSUVASTATIN CALCIUM 20 MG PO TABS
40.0000 mg | ORAL_TABLET | Freq: Every day | ORAL | Status: DC
  Administered 2021-04-08 – 2021-04-10 (×3): 40 mg via ORAL
  Filled 2021-04-08 (×3): qty 2

## 2021-04-08 NOTE — Progress Notes (Signed)
Patient admitted to [facility] from ED. Patient is alert and oriented x4. Vital signs are stable and she is on room air. Has no complaints of pain. Skin is intact, no signs of skin breakdown noted on exam. Patient belongings at bedside (clothing). The patient was shown how to use the call bell. Call bell and bedside table are within reach; bed is in the lowest position.

## 2021-04-08 NOTE — H&P (Signed)
History and Physical    Victoria Patel JXB:[id number] DOB: May 25, 1966 DOA: 04/07/2021  PCP: Pcp, No  Patient coming from: Home.  Chief Complaint: Drug overdose.  HPI: Victoria Patel is a 55 y.o. female with history of hypertension, hypothyroidism, depression intentionally overdosed with 14 pills of Advil PM yesterday around 10 AM.  Following which patient's husband brought her to the ER at [facility] and was transferred to [facility] for medical clearance.  Patient states he has been depressed and suicidal.  Denies any chest pain shortness of breath.  Did drink alcohol but denies taking any Tylenol or overdosing with any other drugs.  ED Course: In the ER patient's labs show potassium of 2.8 and EKG initially showed QT C of 4 885 ms which repeated showed worsening to 615.  Magnesium also is low at 1.5.  Poison control at this time recommended correcting potassium magnesium and making sure that the QTC is less than 500 before medical clearance.  Patient vital signs has to be noted also.  Review of Systems: As per HPI, rest all negative.   Past Medical History:  Diagnosis Date   Anxiety    Depression    GERD (gastroesophageal reflux disease)    HTN (hypertension)     History reviewed. No pertinent surgical history.   reports that she has never smoked. She has never used smokeless tobacco. She reports current alcohol use. She reports previous drug use.  No Known Allergies  History reviewed. No pertinent family history.  Prior to Admission medications   Medication Sig Start Date End Date Taking? Authorizing Provider  amLODipine (NORVASC) 10 MG tablet Take 10 mg by mouth daily. 03/20/21  Yes [provider]  atenolol (TENORMIN) 50 MG tablet Take 50 mg by mouth daily. 03/28/21  Yes [provider]  diphenhydramine-acetaminophen (TYLENOL PM) 25-500 MG TABS tablet Take 2 tablets by mouth at bedtime.   Yes [provider]  disulfiram (ANTABUSE) 250 MG tablet Take  500 mg by mouth every morning. 03/30/21  Yes [provider]  escitalopram (LEXAPRO) 20 MG tablet Take 20 mg by mouth daily. 03/28/21  Yes [provider]  Esomeprazole Magnesium (NEXIUM 24HR) 20 MG TBEC Take 20 mg by mouth daily.   Yes [provider]  levothyroxine (SYNTHROID) 75 MCG tablet Take 75 mcg by mouth every morning. 03/21/21  Yes [provider]  lisinopril-hydrochlorothiazide (ZESTORETIC) 20-12.5 MG tablet Take 2 tablets by mouth daily. 03/28/21  Yes [provider]  OVER THE COUNTER MEDICATION Place 1 drop into both eyes daily as needed (dry eyes). Dry eye drop   Yes [provider]  rosuvastatin (CRESTOR) 40 MG tablet Take 40 mg by mouth daily. 03/20/21  Yes [provider]    Physical Exam: Constitutional: Moderately built and nourished. Vitals:   04/08/21 0030 04/08/21 0100 04/08/21 0130 04/08/21 0200  BP: (!) 139/101 (!) 144/94 (!) 143/84 (!) 146/99  Pulse: 66 70 64 64  Resp: 16 15 14 15   Temp:      TempSrc:      SpO2: 91% 93% 95% 95%  Weight:      Height:       Eyes: Anicteric no pallor. ENMT: No discharge from the ears eyes nose and mouth. Neck: No mass felt.  No neck rigidity. Respiratory: No rhonchi or crepitations. Cardiovascular: S1-S2 heard. Abdomen: Soft nontender bowel sounds present. Musculoskeletal: No edema. Skin: No rash. Neurologic: Alert awake oriented to time place and person.  Moves all extremities. Psychiatric:  Depressed and suicidal.   Labs on Admission: I have personally reviewed following labs and imaging studies  CBC: Recent Labs  Lab 04/07/21 1658  WBC 6.9  NEUTROABS 3.4  HGB 13.2  HCT 39.0  MCV 98.7  PLT 232   Basic Metabolic Panel: Recent Labs  Lab 04/07/21 1658 04/07/21 2028  NA 139 140  K 2.8* 3.0*  CL 103 106  CO2 23 22  GLUCOSE 84 142*  BUN 13 12  CREATININE 0.92 0.82  CALCIUM 9.5 9.5  MG  --  1.5*   GFR: Estimated Creatinine Clearance: 90.7 mL/min (by  C-G formula based on SCr of 0.82 mg/dL). Liver Function Tests: Recent Labs  Lab 04/07/21 1658  AST 49*  ALT 45*  ALKPHOS 39  BILITOT 0.6  PROT 6.2*  ALBUMIN 3.6   No results for input(s): LIPASE, AMYLASE in the last 168 hours. No results for input(s): AMMONIA in the last 168 hours. Coagulation Profile: No results for input(s): INR, PROTIME in the last 168 hours. Cardiac Enzymes: No results for input(s): CKTOTAL, CKMB, CKMBINDEX, TROPONINI in the last 168 hours. BNP (last 3 results) No results for input(s): PROBNP in the last 8760 hours. HbA1C: No results for input(s): HGBA1C in the last 72 hours. CBG: No results for input(s): GLUCAP in the last 168 hours. Lipid Profile: No results for input(s): CHOL, HDL, LDLCALC, TRIG, CHOLHDL, LDLDIRECT in the last 72 hours. Thyroid Function Tests: No results for input(s): TSH, T4TOTAL, FREET4, T3FREE, THYROIDAB in the last 72 hours. Anemia Panel: No results for input(s): VITAMINB12, FOLATE, FERRITIN, TIBC, IRON, RETICCTPCT in the last 72 hours. Urine analysis: No results found for: COLORURINE, APPEARANCEUR, LABSPEC, PHURINE, GLUCOSEU, HGBUR, BILIRUBINUR, KETONESUR, PROTEINUR, UROBILINOGEN, NITRITE, LEUKOCYTESUR Sepsis Labs: @LABRCNTIP (procalcitonin:4,lacticidven:4) ) Recent Results (from the past 240 hour(s))  Resp Panel by RT-PCR (Flu A&B, Covid) Urine, Clean Catch     Status: None   Collection Time: 04/07/21  4:58 PM   Specimen: Urine, Clean Catch; Nasopharyngeal(NP) swabs in vial transport medium  Result Value Ref Range Status   SARS Coronavirus 2 by RT PCR NEGATIVE NEGATIVE Final    Comment: (NOTE) SARS-CoV-2 target nucleic acids are NOT DETECTED.  The SARS-CoV-2 RNA is generally detectable in upper respiratory specimens during the acute phase of infection. The lowest concentration of SARS-CoV-2 viral copies this assay can detect is 138 copies/mL. A negative result does not preclude SARS-Cov-2 infection and should not be used as  the sole basis for treatment or other patient management decisions. A negative result may occur with  improper specimen collection/handling, submission of specimen other than nasopharyngeal swab, presence of viral mutation(s) within the areas targeted by this assay, and inadequate number of viral copies(<138 copies/mL). A negative result must be combined with clinical observations, patient history, and epidemiological information. The expected result is Negative.  Fact Sheet for Patients:  06/07/21  Fact Sheet for Healthcare Providers:  BloggerCourse.com  This test is no t yet approved or cleared by the [place] FDA and  has been authorized for detection and/or diagnosis of SARS-CoV-2 by FDA under an Emergency Use Authorization (EUA). This EUA will remain  in effect (meaning this test can be used) for the duration of the COVID-19 declaration under Section 564(b)(1) of the Act, 21 U.S.C.section 360bbb-3(b)(1), unless the authorization is terminated  or revoked sooner.       Influenza A by PCR NEGATIVE NEGATIVE Final   Influenza B by PCR NEGATIVE NEGATIVE Final    Comment: (NOTE) The Xpert Xpress SARS-CoV-2/FLU/RSV  plus assay is intended as an aid in the diagnosis of influenza from Nasopharyngeal swab specimens and should not be used as a sole basis for treatment. Nasal washings and aspirates are unacceptable for Xpert Xpress SARS-CoV-2/FLU/RSV testing.  Fact Sheet for Patients: BloggerCourse.com  Fact Sheet for Healthcare Providers: SeriousBroker.it  This test is not yet approved or cleared by the [place] FDA and has been authorized for detection and/or diagnosis of SARS-CoV-2 by FDA under an Emergency Use Authorization (EUA). This EUA will remain in effect (meaning this test can be used) for the duration of the COVID-19 declaration under Section 564(b)(1) of  the Act, 21 U.S.C. section 360bbb-3(b)(1), unless the authorization is terminated or revoked.  Performed at [facility], [address], [place], [place] [address]      Radiological Exams on Admission: No results found.  EKG: Independently reviewed.  Normal sinus rhythm with QTC of 615 ms.  Assessment/Plan Principal Problem:   Drug overdose Active Problems:   Essential hypertension   Suicidal ideation   Hypokalemia   Alcohol abuse    Intentional drug overdose with Advil PM which contains ibuprofen and diphenhydramine.  Patient took a total of 14 pills.  At this time Poison control is advised to follow-up patient EKG in character QTC by correcting her potassium and magnesium.  Also watch out for tachycardia and elevated blood pressure.  If QTC is less than 500 and vital signs are within normal limits then patient can be cleared.  Presently patient's potassium and magnesium has been low for which correction has been ordered.  We will repeat EKG after repeating metabolic panel again. Depression with suicidal ideation on suicide precautions.  Poison control advised to hold Lexapro until QTC is corrected. Hypertension on lisinopril, atenolol and amlodipine.  Holding hydrochlorothiazide due to hypokalemia and hypomagnesemia.  As needed IV hydralazine. Alcohol abuse on CIWA protocol. Hypothyroidism on Synthroid.   DVT prophylaxis: SCDs.  Avoiding anticoagulation in the setting of drug overdose. Code Status: Full code. Family Communication: We will need to discuss with family. Disposition Plan: May need behavioral health inpatient. Consults called: Psychiatry. Admission status: Observation.   Eduard Clos MD [facility] Pager [phone].  If 7PM-7AM, please contact night-coverage [email or web address] Password [email or web address]  04/08/2021, 3:50 AM

## 2021-04-08 NOTE — Consult Note (Signed)
[facility] Face-to-Face Psychiatry Consult   Reason for Consult:  Referring Physician:''SI and depression. Intention drug OD.'' Patient Identification: Victoria Patel MRN:  [id number] Principal Diagnosis: Major depressive disorder, single episode, severe (HCC) Diagnosis:  Principal Problem:   Major depressive disorder, single episode, severe (HCC) Active Problems:   Drug overdose   Essential hypertension   Suicidal ideation   Hypokalemia   Alcohol abuse   Alcohol use disorder, severe, dependence (HCC)   Total Time spent with patient: 1 hour  Subjective:   Victoria Patel is a 55 y.o. female patient admitted with intentional overdose.  HPI:  Patient who reports long history of Alcohol use disorder dating back to age 45. She was brought to the hospital after she intentionally attempted suicide  by overdosing on 15 tablets of Advil PM and alcohol. Patient reports multiple stressors which include recent move to [place] from [place], lack of friends in her new location, cat is about to die and her inability to stop drinking on daily basis despite several attempts to quit. Patient reports that her inability to stop drinking has  caused her to be perpetually depressed and having recurrent suicidal thoughts with plan to sleep and not wake up. She reports at least one prior history of suicide attempt 2 years ago. And history of alcohol rehabilitation twice in past at [place], [place] and [facility] in [place].She denies psychosis, delusions and homicidal thoughts.  Past Psychiatric History: as above  Risk to Self:  yes-unable to contract for safety Risk to Others:  denies Prior Inpatient Therapy:  yes Prior Outpatient Therapy:  yes-AA meeting  Past Medical History:  Past Medical History:  Diagnosis Date   Anxiety    Depression    GERD (gastroesophageal reflux disease)    HTN (hypertension)    History reviewed. No pertinent surgical history. Family History: History reviewed. No pertinent family  history. Family Psychiatric  History:  Social History:  Social History   Substance and Sexual Activity  Alcohol Use Yes   Comment: pint a day     Social History   Substance and Sexual Activity  Drug Use Not Currently    Social History   Socioeconomic History   Marital status: Married    Spouse name: Not on file   Number of children: Not on file   Years of education: Not on file   Highest education level: Not on file  Occupational History   Not on file  Tobacco Use   Smoking status: Never   Smokeless tobacco: Never  Substance and Sexual Activity   Alcohol use: Yes    Comment: pint a day   Drug use: Not Currently   Sexual activity: Not on file  Other Topics Concern   Not on file  Social History Narrative   Not on file   Social Determinants of Health   Financial Resource Strain: Not on file  Food Insecurity: Not on file  Transportation Needs: Not on file  Physical Activity: Not on file  Stress: Not on file  Social Connections: Not on file   Additional Social History:    Allergies:  No Known Allergies  Labs:  Results for orders placed or performed during the hospital encounter of 04/07/21 (from the past 48 hour(s))  Acetaminophen level     Status: Abnormal   Collection Time: 04/07/21  4:58 PM  Result Value Ref Range   Acetaminophen (Tylenol), Serum <10 (L) 10 - 30 ug/mL    Comment: (NOTE) Therapeutic concentrations vary significantly. A range of  10-30 ug/mL  may be an effective concentration for many patients. However, some  are best treated at concentrations outside of this range. Acetaminophen concentrations >150 ug/mL at 4 hours after ingestion  and >50 ug/mL at 12 hours after ingestion are often associated with  toxic reactions.  Performed at [facility], [address] [place], [place] [address]   Comprehensive metabolic panel     Status: Abnormal   Collection Time: 04/07/21  4:58 PM  Result Value Ref Range   Sodium 139 135 - 145 mmol/L    Potassium 2.8 (L) 3.5 - 5.1 mmol/L   Chloride 103 98 - 111 mmol/L   CO2 23 22 - 32 mmol/L   Glucose, Bld 84 70 - 99 mg/dL    Comment: Glucose reference range applies only to samples taken after fasting for at least 8 hours.   BUN 13 6 - 20 mg/dL   Creatinine, Ser 7.840.92 0.44 - 1.00 mg/dL   Calcium 9.5 8.9 - 69.610.3 mg/dL   Total Protein 6.2 (L) 6.5 - 8.1 g/dL   Albumin 3.6 3.5 - 5.0 g/dL   AST 49 (H) 15 - 41 U/L   ALT 45 (H) 0 - 44 U/L   Alkaline Phosphatase 39 38 - 126 U/L   Total Bilirubin 0.6 0.3 - 1.2 mg/dL   GFR, Estimated >29>60 >52>60 mL/min    Comment: (NOTE) Calculated using the CKD-EPI Creatinine Equation (2021)    Anion gap 13 5 - 15    Comment: Performed at [facility], [address] [place], [place] [address]  Ethanol     Status: Abnormal   Collection Time: 04/07/21  4:58 PM  Result Value Ref Range   Alcohol, Ethyl (B) 184 (H) <10 mg/dL    Comment: (NOTE) Lowest detectable limit for serum alcohol is 10 mg/dL.  For medical purposes only. Performed at [facility], [address] [place], [place] [address]   CBC with Differential     Status: None   Collection Time: 04/07/21  4:58 PM  Result Value Ref Range   WBC 6.9 4.0 - 10.5 K/uL   RBC 3.95 3.87 - 5.11 MIL/uL   Hemoglobin 13.2 12.0 - 15.0 g/dL   HCT 27.239.0 53.636.0 - 64.446.0 %   MCV 98.7 80.0 - 100.0 fL   MCH 33.4 26.0 - 34.0 pg   MCHC 33.8 30.0 - 36.0 g/dL   RDW 03.412.3 74.211.5 - 59.515.5 %   Platelets 232 150 - 400 K/uL   nRBC 0.0 0.0 - 0.2 %   Neutrophils Relative % 49 %   Neutro Abs 3.4 1.7 - 7.7 K/uL   Lymphocytes Relative 40 %   Lymphs Abs 2.7 0.7 - 4.0 K/uL   Monocytes Relative 9 %   Monocytes Absolute 0.6 0.1 - 1.0 K/uL   Eosinophils Relative 1 %   Eosinophils Absolute 0.1 0.0 - 0.5 K/uL   Basophils Relative 1 %   Basophils Absolute 0.0 0.0 - 0.1 K/uL   Immature Granulocytes 0 %   Abs Immature Granulocytes 0.01 0.00 - 0.07 K/uL    Comment: Performed at [facility], [address]  [place], [place] [address]  Urine rapid drug screen (hosp performed)     Status: Abnormal   Collection Time: 04/07/21  4:58 PM  Result Value Ref Range   Opiates NONE DETECTED NONE DETECTED   Cocaine NONE DETECTED NONE DETECTED   Benzodiazepines NONE DETECTED NONE DETECTED   Amphetamines NONE DETECTED  NONE DETECTED   Tetrahydrocannabinol NONE DETECTED NONE DETECTED   Barbiturates POSITIVE (A) NONE DETECTED    Comment: (NOTE) DRUG SCREEN FOR MEDICAL PURPOSES ONLY.  IF CONFIRMATION IS NEEDED FOR ANY PURPOSE, NOTIFY LAB WITHIN 5 DAYS.  LOWEST DETECTABLE LIMITS FOR URINE DRUG SCREEN Drug Class                     Cutoff (ng/mL) Amphetamine and metabolites    1000 Barbiturate and metabolites    200 Benzodiazepine                 200 Tricyclics and metabolites     300 Opiates and metabolites        300 Cocaine and metabolites        300 THC                            50 Performed at [facility], [address] [place], [place] [address]   Salicylate level     Status: Abnormal   Collection Time: 04/07/21  4:58 PM  Result Value Ref Range   Salicylate Lvl <7.0 (L) 7.0 - 30.0 mg/dL    Comment: Performed at [facility], [address], [place], [place] [address]  Resp Panel by RT-PCR (Flu A&B, Covid) Urine, Clean Catch     Status: None   Collection Time: 04/07/21  4:58 PM   Specimen: Urine, Clean Catch; Nasopharyngeal(NP) swabs in vial transport medium  Result Value Ref Range   SARS Coronavirus 2 by RT PCR NEGATIVE NEGATIVE    Comment: (NOTE) SARS-CoV-2 target nucleic acids are NOT DETECTED.  The SARS-CoV-2 RNA is generally detectable in upper respiratory specimens during the acute phase of infection. The lowest concentration of SARS-CoV-2 viral copies this assay can detect is 138 copies/mL. A negative result does not preclude SARS-Cov-2 infection and should not be used as the sole basis for treatment or other patient management decisions. A negative result may occur with   improper specimen collection/handling, submission of specimen other than nasopharyngeal swab, presence of viral mutation(s) within the areas targeted by this assay, and inadequate number of viral copies(<138 copies/mL). A negative result must be combined with clinical observations, patient history, and epidemiological information. The expected result is Negative.  Fact Sheet for Patients:  BloggerCourse.com  Fact Sheet for Healthcare Providers:  SeriousBroker.it  This test is no t yet approved or cleared by the [place] FDA and  has been authorized for detection and/or diagnosis of SARS-CoV-2 by FDA under an Emergency Use Authorization (EUA). This EUA will remain  in effect (meaning this test can be used) for the duration of the COVID-19 declaration under Section 564(b)(1) of the Act, 21 U.S.C.section 360bbb-3(b)(1), unless the authorization is terminated  or revoked sooner.       Influenza A by PCR NEGATIVE NEGATIVE   Influenza B by PCR NEGATIVE NEGATIVE    Comment: (NOTE) The Xpert Xpress SARS-CoV-2/FLU/RSV plus assay is intended as an aid in the diagnosis of influenza from Nasopharyngeal swab specimens and should not be used as a sole basis for treatment. Nasal washings and aspirates are unacceptable for Xpert Xpress SARS-CoV-2/FLU/RSV testing.  Fact Sheet for Patients: BloggerCourse.com  Fact Sheet for Healthcare Providers: SeriousBroker.it  This test is not yet approved or cleared by the [place] FDA and has been authorized for detection and/or diagnosis of SARS-CoV-2 by FDA under an Emergency Use Authorization (EUA). This  EUA will remain in effect (meaning this test can be used) for the duration of the COVID-19 declaration under Section 564(b)(1) of the Act, 21 U.S.C. section 360bbb-3(b)(1), unless the authorization is terminated or revoked.  Performed at  [facility], [address], [place], [place] [address]   I-Stat beta hCG blood, ED     Status: None   Collection Time: 04/07/21  5:16 PM  Result Value Ref Range   I-stat hCG, quantitative <5.0 <5 mIU/mL   Comment 3            Comment:   GEST. AGE      CONC.  (mIU/mL)   <=1 WEEK        5 - 50     2 WEEKS       50 - 500     3 WEEKS       100 - 10,000     4 WEEKS     1,000 - 30,000        FEMALE AND NON-PREGNANT FEMALE:     LESS THAN 5 mIU/mL   Basic metabolic panel     Status: Abnormal   Collection Time: 04/07/21  8:28 PM  Result Value Ref Range   Sodium 140 135 - 145 mmol/L   Potassium 3.0 (L) 3.5 - 5.1 mmol/L   Chloride 106 98 - 111 mmol/L   CO2 22 22 - 32 mmol/L   Glucose, Bld 142 (H) 70 - 99 mg/dL    Comment: Glucose reference range applies only to samples taken after fasting for at least 8 hours.   BUN 12 6 - 20 mg/dL   Creatinine, Ser 7.25 0.44 - 1.00 mg/dL   Calcium 9.5 8.9 - 36.6 mg/dL   GFR, Estimated >44 >03 mL/min    Comment: (NOTE) Calculated using the CKD-EPI Creatinine Equation (2021)    Anion gap 12 5 - 15    Comment: Performed at [facility], [address], [place], [place] [address]  Magnesium     Status: Abnormal   Collection Time: 04/07/21  8:28 PM  Result Value Ref Range   Magnesium 1.5 (L) 1.7 - 2.4 mg/dL    Comment: Performed at [facility], [address] [place], [place] [address]  Acetaminophen level     Status: Abnormal   Collection Time: 04/07/21  9:14 PM  Result Value Ref Range   Acetaminophen (Tylenol), Serum <10 (L) 10 - 30 ug/mL    Comment: (NOTE) Therapeutic concentrations vary significantly. A range of 10-30 ug/mL  may be an effective concentration for many patients. However, some  are best treated at concentrations outside of this range. Acetaminophen concentrations >150 ug/mL at 4 hours after ingestion  and >50 ug/mL at 12 hours after ingestion are often associated with  toxic reactions.  Performed at [facility], [address] [place], [place] [address]   Basic metabolic panel     Status: None   Collection Time: 04/08/21  6:12 AM  Result Value Ref Range   Sodium 140 135 - 145 mmol/L   Potassium 3.6 3.5 - 5.1 mmol/L   Chloride 104 98 - 111 mmol/L   CO2 27 22 - 32 mmol/L   Glucose, Bld 87 70 - 99 mg/dL    Comment: Glucose reference range applies only to samples taken after fasting for at least 8 hours.   BUN 11 6 - 20 mg/dL   Creatinine, Ser 3.29 0.44 - 1.00 mg/dL  Calcium 9.5 8.9 - 10.3 mg/dL   GFR, Estimated >67 >12 mL/min    Comment: (NOTE) Calculated using the CKD-EPI Creatinine Equation (2021)    Anion gap 9 5 - 15    Comment: Performed at [facility], [address], [place], [place] [address]  Magnesium     Status: None   Collection Time: 04/08/21  6:12 AM  Result Value Ref Range   Magnesium 2.1 1.7 - 2.4 mg/dL    Comment: Performed at [facility], [address], [place], [place] [address]    Current Facility-Administered Medications  Medication Dose Route Frequency Provider Last Rate Last Admin   amLODipine (NORVASC) tablet 10 mg  10 mg Oral Daily Eduard Clos, MD   10 mg at 04/08/21 1046   atenolol (TENORMIN) tablet 50 mg  50 mg Oral Daily Eduard Clos, MD   50 mg at 04/08/21 1045   FLUoxetine (PROZAC) capsule 20 mg  20 mg Oral Daily Lianah Peed, MD       folic acid (FOLVITE) tablet 1 mg  1 mg Oral Daily Eduard Clos, MD   1 mg at 04/08/21 1045   gabapentin (NEURONTIN) capsule 100 mg  100 mg Oral BID Gladiola Madore, MD       levothyroxine (SYNTHROID) tablet 75 mcg  75 mcg Oral Q0600 Eduard Clos, MD   75 mcg at 04/08/21 0541   lisinopril (ZESTRIL) tablet 20 mg  20 mg Oral Daily Eduard Clos, MD   20 mg at 04/08/21 1045   LORazepam (ATIVAN) tablet 1-4 mg  1-4 mg Oral Q1H PRN Eduard Clos, MD       Or   LORazepam (ATIVAN) injection 1-4 mg  1-4 mg Intravenous Q1H PRN Eduard Clos, MD   2  mg at 04/08/21 0735   multivitamin with minerals tablet 1 tablet  1 tablet Oral Daily Eduard Clos, MD   1 tablet at 04/08/21 1045   rosuvastatin (CRESTOR) tablet 40 mg  40 mg Oral Daily Eduard Clos, MD   40 mg at 04/08/21 1045   thiamine tablet 100 mg  100 mg Oral Daily Eduard Clos, MD   100 mg at 04/08/21 1045   Or   thiamine (B-1) injection 100 mg  100 mg Intravenous Daily Eduard Clos, MD        Musculoskeletal: Strength & Muscle Tone: within normal limits Gait & Station: normal Patient leans: N/A    Psychiatric Specialty Exam:  Presentation  General Appearance: Appropriate for Environment  Eye Contact:Good  Speech:Clear and Coherent  Speech Volume:Decreased  Handedness:Right   Mood and Affect  Mood:Depressed  Affect:Tearful; Constricted   Thought Process  Thought Processes:Coherent; Linear  Descriptions of Associations:Intact  Orientation:Full (Time, Place and Person)  Thought Content:Logical  History of Schizophrenia/Schizoaffective disorder:No  Duration of Psychotic Symptoms:No data recorded Hallucinations:Hallucinations: None  Ideas of Reference:None  Suicidal Thoughts:Suicidal Thoughts: Yes, Active SI Active Intent and/or Plan: With Plan  Homicidal Thoughts:Homicidal Thoughts: No   Sensorium  Memory:Immediate Good; Recent Good; Remote Good  Judgment:Poor  Insight:Fair   Executive Functions  Concentration:Good  Attention Span:Good  Recall:Good  Fund of Knowledge:Good  Language:Good   Psychomotor Activity  Psychomotor Activity:Psychomotor Activity: Psychomotor Retardation   Assets  Assets:Communication Skills; Desire for Improvement; Social Support   Sleep  Sleep:Sleep: Poor   Physical Exam: Physical Exam Psychiatric:        Attention and Perception: Attention and perception normal.  Mood and Affect: Mood is depressed. Affect is tearful.        Speech: Speech normal.         Behavior: Behavior is cooperative.        Thought Content: Thought content includes suicidal ideation. Thought content includes suicidal plan.        Cognition and Memory: Cognition and memory normal.   Review of Systems  Constitutional:  Positive for malaise/fatigue.  HENT: Negative.    Eyes: Negative.   Respiratory: Negative.    Cardiovascular: Negative.   Gastrointestinal:  Positive for nausea.  Genitourinary: Negative.   Musculoskeletal:  Positive for myalgias.  Skin: Negative.   Psychiatric/Behavioral:  Positive for depression, substance abuse and suicidal ideas.   Blood pressure (!) 150/95, pulse 68, temperature 98.5 F (36.9 C), temperature source Oral, resp. rate 16, height 5\' 8"  (1.727 m), weight 89.4 kg, SpO2 99 %. Body mass index is 29.95 kg/m.  Treatment Plan Summary: 56 year old female with long history of alcohol use disorder-severe who was admitted after she intentionally attempted suicide by overdosing. Also, patient reports worsening depression and recurrent suicidal thoughts due to many stressors. Patient will benefit from inpatient psychiatric admission after she is medically stabilized.  Recommendations: -Continue 1:1 sitter for safety -Continue Alcohol withdrawal detox protocol -Consider Gabapentin 100 mg twice daily for mood/alcohol withdrawal -Consider Prozac 20 mg daily for depression -Consider social worker consult to facilitate inpatient psychiatric admission. -Consider IVC if patient refuse Voluntary inpatient psychiatric admission  Disposition:  Recommend psychiatric Inpatient admission when medically cleared. Supportive therapy provided about ongoing stressors. Psychiatric service signing off. Re-consult as needed.   52, MD 04/08/2021 11:39 AM

## 2021-04-08 NOTE — ED Notes (Signed)
Attempted to call report. RN in pt care at this time. Extension given for callback.

## 2021-04-08 NOTE — Progress Notes (Signed)
Patient 55 year old female history of hypertension, hypothyroidism, depression presented to the ED with intentional overdose on 14 pills of Advil the night prior to admission around 10 AM and subsequently brought to the ED at [facility] and subsequently transferred to [facility] for medical clearance.  Patient endorsed depression with suicidal ideation.  Patient noted to be hypokalemic with QTC prolongation on EKG.  Poison control contacted who recommended electrolyte repletion, serial EKG to ensure resolution of QTC prolongation.  Patient with history of alcohol abuse and subsequently placed on Ativan withdrawal protocol.  Consulted with psychiatry for further evaluation and management.   No charge.

## 2021-04-09 DIAGNOSIS — F1010 Alcohol abuse, uncomplicated: Secondary | ICD-10-CM

## 2021-04-09 DIAGNOSIS — R9431 Abnormal electrocardiogram [ECG] [EKG]: Secondary | ICD-10-CM | POA: Diagnosis present

## 2021-04-09 DIAGNOSIS — E039 Hypothyroidism, unspecified: Secondary | ICD-10-CM

## 2021-04-09 LAB — BASIC METABOLIC PANEL WITH GFR
Anion gap: 5 (ref 5–15)
BUN: 11 mg/dL (ref 6–20)
CO2: 28 mmol/L (ref 22–32)
Calcium: 9.9 mg/dL (ref 8.9–10.3)
Chloride: 104 mmol/L (ref 98–111)
Creatinine, Ser: 0.83 mg/dL (ref 0.44–1.00)
GFR, Estimated: >60 mL/min
Glucose, Bld: 87 mg/dL (ref 70–99)
Potassium: 3.9 mmol/L (ref 3.5–5.1)
Sodium: 137 mmol/L (ref 135–145)

## 2021-04-09 LAB — CBC WITH DIFFERENTIAL/PLATELET
Abs Immature Granulocytes: 0.01 10*3/uL (ref 0.00–0.07)
Basophils Absolute: 0 10*3/uL (ref 0.0–0.1)
Basophils Relative: 1 %
Eosinophils Absolute: 0.2 10*3/uL (ref 0.0–0.5)
Eosinophils Relative: 4 %
HCT: 37.2 % (ref 36.0–46.0)
Hemoglobin: 12.5 g/dL (ref 12.0–15.0)
Immature Granulocytes: 0 %
Lymphocytes Relative: 36 %
Lymphs Abs: 2.1 10*3/uL (ref 0.7–4.0)
MCH: 33.2 pg (ref 26.0–34.0)
MCHC: 33.6 g/dL (ref 30.0–36.0)
MCV: 98.9 fL (ref 80.0–100.0)
Monocytes Absolute: 0.7 10*3/uL (ref 0.1–1.0)
Monocytes Relative: 12 %
Neutro Abs: 2.7 10*3/uL (ref 1.7–7.7)
Neutrophils Relative %: 47 %
Platelets: 210 10*3/uL (ref 150–400)
RBC: 3.76 MIL/uL — ABNORMAL LOW (ref 3.87–5.11)
RDW: 12.4 % (ref 11.5–15.5)
WBC: 5.7 10*3/uL (ref 4.0–10.5)
nRBC: 0 % (ref 0.0–0.2)

## 2021-04-09 LAB — MAGNESIUM: Magnesium: 2 mg/dL (ref 1.7–2.4)

## 2021-04-09 MED ORDER — CHLORDIAZEPOXIDE HCL 25 MG PO CAPS: 25.0000 mg | ORAL_CAPSULE | ORAL | Status: DC

## 2021-04-09 MED ORDER — LOPERAMIDE HCL 2 MG PO CAPS: 2.0000 mg | ORAL_CAPSULE | ORAL | Status: DC | PRN

## 2021-04-09 MED ORDER — HYDROXYZINE HCL 25 MG PO TABS
25.0000 mg | ORAL_TABLET | Freq: Four times a day (QID) | ORAL | Status: DC | PRN
  Administered 2021-04-09 – 2021-04-10 (×3): 25 mg via ORAL
  Filled 2021-04-09 (×3): qty 1

## 2021-04-09 MED ORDER — CHLORDIAZEPOXIDE HCL 25 MG PO CAPS: 25.0000 mg | ORAL_CAPSULE | Freq: Four times a day (QID) | ORAL | Status: DC | PRN

## 2021-04-09 MED ORDER — CHLORDIAZEPOXIDE HCL 25 MG PO CAPS: 25.0000 mg | ORAL_CAPSULE | Freq: Every day | ORAL | Status: DC

## 2021-04-09 MED ORDER — ONDANSETRON 4 MG PO TBDP: 4.0000 mg | ORAL_TABLET | Freq: Four times a day (QID) | ORAL | Status: DC | PRN

## 2021-04-09 MED ORDER — CHLORDIAZEPOXIDE HCL 25 MG PO CAPS: 25.0000 mg | ORAL_CAPSULE | Freq: Three times a day (TID) | ORAL | Status: DC

## 2021-04-09 MED ORDER — CHLORDIAZEPOXIDE HCL 25 MG PO CAPS
25.0000 mg | ORAL_CAPSULE | Freq: Four times a day (QID) | ORAL | Status: DC
  Administered 2021-04-09 – 2021-04-10 (×5): 25 mg via ORAL
  Filled 2021-04-09 (×5): qty 1

## 2021-04-09 NOTE — Progress Notes (Signed)
CSW spoke with Jamal in the [facility] disposition office regarding [facility] review of this pt for inpatient psychiatric admission.  [place], [facility], at [facility] contacted by secure chat. Daleen Squibb, MSW, LCSW 6/12/20224:12 PM

## 2021-04-09 NOTE — Progress Notes (Addendum)
PROGRESS NOTE    Victoria Patel  UYQ:[id number] DOB: 1966/01/07 DOA: 04/07/2021 PCP: Pcp, No    Chief Complaint  Patient presents with   Ingestion    Brief Narrative:  Patient 55 year old female history of hypertension, hypothyroidism, depression presented with intentional overdose of 14 pills of Advil PM with history of depression and presented with suicidal ideation.  Patient's husband brought patient to the ED.  Patient noted to have significant electrolyte abnormalities, EKG with QTC prolongation.  Poison control recommended repletion of electrolytes, serial EKGs for resolution of QTC prolongation.  Psychiatry consulted and assessed patient patient started on Prozac and gabapentin and recommendations were for inpatient psychiatric admission once medically stable.   Assessment & Plan:   Principal Problem:   Major depressive disorder, single episode, severe (HCC) Active Problems:   Drug overdose   Essential hypertension   Suicidal ideation   Hypokalemia   Alcohol abuse   Alcohol use disorder, severe, dependence (HCC)   Hypothyroidism   QT prolongation   1 intentional drug overdose with Advil PM/suicidal ideation -Patient had presented with intentional overdose on Advil PM x14 pills.  Poison control advised follow-up on EKG for QTC prolongation after correction of electrolytes. -Electrolytes corrected. -QTC prolongation resolved. -Psychiatry consulted and patient started on gabapentin and Prozac and recommended inpatient psychiatric admission. -Per psychiatry patient refuses inpatient psychiatric admission will need IVC. -Continue one-to-one sitter.  2.  Hypokalemia/hypomagnesemia -Repleted.  3.  Depression suicidal ideation on suicide precautions -Lexapro held on admission due to QTC prolongation and subsequently discontinued. -Patient seen in consultation by psychiatry and patient started on Prozac and gabapentin. -Psychiatry recommended inpatient psychiatric admission  once medically stable. -Patient medically stable.  4.  QTC prolongation -Secondary to problem #1 -Resolved.  5.  Hypothyroidism -Continue home dose Synthroid.  6.  Alcohol abuse -Stable. -No signs of withdrawal. -Continue the Ativan withdrawal protocol, thiamine, folic acid, multivitamin. -Place on Librium detox protocol.  7.  Hypertension -Lisinopril increased to 40 mg daily for better blood pressure control.  Continue atenolol and Norvasc. -Outpatient follow-up.  8. Hyperlipidemia -Crestor.    DVT prophylaxis: SCDs Code Status: Full Family Communication: Updated patient and husband at bedside. Disposition:   Status is: Inpatient  Remains inpatient appropriate because:Unsafe d/c plan  Dispo: The patient is from: Home              Anticipated d/c is to: Inpatient psychiatry              Patient currently is medically stable to d/c.   Difficult to place patient No       Consultants:  Psychiatry: Dr. Jannifer Franklin 04/08/2021  Procedures:  None  Antimicrobials:  None   Subjective: Patient sitting up in bed tearful.  Depressed.  Denies any suicidal or homicidal ideation.  Husband at bedside.  Objective: Vitals:   04/09/21 0944 04/09/21 1047 04/09/21 1135 04/09/21 1221  BP: (!) 170/110 (!) 133/104  (!) 131/100  Pulse:  68    Resp:    14  Temp:    98.8 F (37.1 C)  TempSrc:    Oral  SpO2:   91% 95%  Weight:      Height:       No intake or output data in the 24 hours ending 04/09/21 1438 Filed Weights   04/07/21 1650  Weight: 89.4 kg    Examination:  General exam: NAD.  Tearful. Respiratory system: Clear to auscultation. Respiratory effort normal. Cardiovascular system: S1 & S2 heard, RRR. No JVD, murmurs,  rubs, gallops or clicks. No pedal edema. Gastrointestinal system: Abdomen is nondistended, soft and nontender. No organomegaly or masses felt. Normal bowel sounds heard. Central nervous system: Alert and oriented. No focal neurological  deficits. Extremities: Symmetric 5 x 5 power. Skin: No rashes, lesions or ulcers Psychiatry: Judgement and insight appear fair.  Mood and affect depressed.      Data Reviewed: I have personally reviewed following labs and imaging studies  CBC: Recent Labs  Lab 04/07/21 1658 04/09/21 0029  WBC 6.9 5.7  NEUTROABS 3.4 2.7  HGB 13.2 12.5  HCT 39.0 37.2  MCV 98.7 98.9  PLT 232 210    Basic Metabolic Panel: Recent Labs  Lab 04/07/21 1658 04/07/21 2028 04/08/21 0612 04/09/21 0029  NA 139 140 140 137  K 2.8* 3.0* 3.6 3.9  CL 103 106 104 104  CO2 23 22 27 28   GLUCOSE 84 142* 87 87  BUN 13 12 11 11   CREATININE 0.92 0.82 0.75 0.83  CALCIUM 9.5 9.5 9.5 9.9  MG  --  1.5* 2.1 2.0    GFR: Estimated Creatinine Clearance: 89.6 mL/min (by C-G formula based on SCr of 0.83 mg/dL).  Liver Function Tests: Recent Labs  Lab 04/07/21 1658  AST 49*  ALT 45*  ALKPHOS 39  BILITOT 0.6  PROT 6.2*  ALBUMIN 3.6    CBG: No results for input(s): GLUCAP in the last 168 hours.   Recent Results (from the past 240 hour(s))  Resp Panel by RT-PCR (Flu A&B, Covid) Urine, Clean Catch     Status: None   Collection Time: 04/07/21  4:58 PM   Specimen: Urine, Clean Catch; Nasopharyngeal(NP) swabs in vial transport medium  Result Value Ref Range Status   SARS Coronavirus 2 by RT PCR NEGATIVE NEGATIVE Final    Comment: (NOTE) SARS-CoV-2 target nucleic acids are NOT DETECTED.  The SARS-CoV-2 RNA is generally detectable in upper respiratory specimens during the acute phase of infection. The lowest concentration of SARS-CoV-2 viral copies this assay can detect is 138 copies/mL. A negative result does not preclude SARS-Cov-2 infection and should not be used as the sole basis for treatment or other patient management decisions. A negative result may occur with  improper specimen collection/handling, submission of specimen other than nasopharyngeal swab, presence of viral mutation(s) within  the areas targeted by this assay, and inadequate number of viral copies(<138 copies/mL). A negative result must be combined with clinical observations, patient history, and epidemiological information. The expected result is Negative.  Fact Sheet for Patients:  06/07/21  Fact Sheet for Healthcare Providers:  06/07/21  This test is no t yet approved or cleared by the [employer] FDA and  has been authorized for detection and/or diagnosis of SARS-CoV-2 by FDA under Patel Emergency Use Authorization (EUA). This EUA will remain  in effect (meaning this test can be used) for the duration of the COVID-19 declaration under Section 564(b)(1) of the Act, 21 U.S.C.section 360bbb-3(b)(1), unless the authorization is terminated  or revoked sooner.       Influenza A by PCR NEGATIVE NEGATIVE Final   Influenza B by PCR NEGATIVE NEGATIVE Final    Comment: (NOTE) The Xpert Xpress SARS-CoV-2/FLU/RSV plus assay is intended as Patel aid in the diagnosis of influenza from Nasopharyngeal swab specimens and should not be used as a sole basis for treatment. Nasal washings and aspirates are unacceptable for Xpert Xpress SARS-CoV-2/FLU/RSV testing.  Fact Sheet for Patients: SeriousBroker.it  Fact Sheet for Healthcare Providers: [place]  This test is  not yet approved or cleared by the [place] and has been authorized for detection and/or diagnosis of SARS-CoV-2 by FDA under Patel Emergency Use Authorization (EUA). This EUA will remain in effect (meaning this test can be used) for the duration of the COVID-19 declaration under Section 564(b)(1) of the Act, 21 U.S.C. section 360bbb-3(b)(1), unless the authorization is terminated or revoked.  Performed at [facility], [address] [place], [place] [address]          Radiology Studies: No results  found.      Scheduled Meds:  amLODipine  10 mg Oral Daily   atenolol  50 mg Oral Daily   FLUoxetine  20 mg Oral Daily   folic acid  1 mg Oral Daily   gabapentin  100 mg Oral BID   levothyroxine  75 mcg Oral Q0600   lisinopril  40 mg Oral Daily   multivitamin with minerals  1 tablet Oral Daily   rosuvastatin  40 mg Oral Daily   thiamine  100 mg Oral Daily   Or   thiamine  100 mg Intravenous Daily   Continuous Infusions:   LOS: 1 day    Time spent: 35 minutes    Ramiro Harvest, MD [facility]   To contact the attending provider between 7A-7P or the covering provider during after hours 7P-7A, please log into the web site [email or web address] and access using universal [place] password for that web site. If you do not have the password, please call the hospital operator.  04/09/2021, 2:38 PM

## 2021-04-10 ENCOUNTER — Encounter (HOSPITAL_COMMUNITY): Payer: Self-pay | Admitting: Psychiatry

## 2021-04-10 ENCOUNTER — Other Ambulatory Visit: Payer: Self-pay

## 2021-04-10 ENCOUNTER — Inpatient Hospital Stay (HOSPITAL_COMMUNITY)
Admission: AD | Admit: 2021-04-10 | Discharge: 2021-04-14 | DRG: 885 | Disposition: A | Discharge status: Home / Self Care | Payer: BC Managed Care – PPO | Source: Intra-hospital | Attending: Psychiatry | Admitting: Psychiatry

## 2021-04-10 DIAGNOSIS — F251 Schizoaffective disorder, depressive type: Secondary | ICD-10-CM | POA: Diagnosis present

## 2021-04-10 DIAGNOSIS — F322 Major depressive disorder, single episode, severe without psychotic features: Principal | ICD-10-CM | POA: Diagnosis present

## 2021-04-10 DIAGNOSIS — F259 Schizoaffective disorder, unspecified: Secondary | ICD-10-CM | POA: Diagnosis present

## 2021-04-10 DIAGNOSIS — F1020 Alcohol dependence, uncomplicated: Secondary | ICD-10-CM | POA: Diagnosis present

## 2021-04-10 DIAGNOSIS — F419 Anxiety disorder, unspecified: Secondary | ICD-10-CM | POA: Diagnosis not present

## 2021-04-10 DIAGNOSIS — T50901A Poisoning by unspecified drugs, medicaments and biological substances, accidental (unintentional), initial encounter: Secondary | ICD-10-CM | POA: Diagnosis present

## 2021-04-10 DIAGNOSIS — Z79890 Hormone replacement therapy (postmenopausal): Secondary | ICD-10-CM | POA: Diagnosis not present

## 2021-04-10 DIAGNOSIS — T39312A Poisoning by propionic acid derivatives, intentional self-harm, initial encounter: Secondary | ICD-10-CM | POA: Diagnosis present

## 2021-04-10 DIAGNOSIS — R9431 Abnormal electrocardiogram [ECG] [EKG]: Secondary | ICD-10-CM | POA: Diagnosis present

## 2021-04-10 DIAGNOSIS — Z20822 Contact with and (suspected) exposure to covid-19: Secondary | ICD-10-CM | POA: Diagnosis present

## 2021-04-10 DIAGNOSIS — E569 Vitamin deficiency, unspecified: Secondary | ICD-10-CM | POA: Diagnosis not present

## 2021-04-10 DIAGNOSIS — G4700 Insomnia, unspecified: Secondary | ICD-10-CM | POA: Diagnosis present

## 2021-04-10 DIAGNOSIS — K219 Gastro-esophageal reflux disease without esophagitis: Secondary | ICD-10-CM | POA: Diagnosis not present

## 2021-04-10 DIAGNOSIS — I10 Essential (primary) hypertension: Secondary | ICD-10-CM | POA: Diagnosis present

## 2021-04-10 DIAGNOSIS — T6592XA Toxic effect of unspecified substance, intentional self-harm, initial encounter: Secondary | ICD-10-CM

## 2021-04-10 DIAGNOSIS — F10239 Alcohol dependence with withdrawal, unspecified: Secondary | ICD-10-CM | POA: Diagnosis not present

## 2021-04-10 DIAGNOSIS — T50902S Poisoning by unspecified drugs, medicaments and biological substances, intentional self-harm, sequela: Secondary | ICD-10-CM

## 2021-04-10 DIAGNOSIS — E039 Hypothyroidism, unspecified: Secondary | ICD-10-CM | POA: Diagnosis not present

## 2021-04-10 DIAGNOSIS — T6591XA Toxic effect of unspecified substance, accidental (unintentional), initial encounter: Secondary | ICD-10-CM

## 2021-04-10 DIAGNOSIS — R45851 Suicidal ideations: Secondary | ICD-10-CM | POA: Diagnosis not present

## 2021-04-10 HISTORY — DX: Hypothyroidism, unspecified: E03.9

## 2021-04-10 LAB — BASIC METABOLIC PANEL
Anion gap: 6 (ref 5–15)
BUN: 12 mg/dL (ref 6–20)
CO2: 28 mmol/L (ref 22–32)
Calcium: 10.2 mg/dL (ref 8.9–10.3)
Chloride: 103 mmol/L (ref 98–111)
Creatinine, Ser: 0.9 mg/dL (ref 0.44–1.00)
GFR, Estimated: >60 mL/min (ref >60)
Glucose, Bld: 95 mg/dL (ref 70–99)
Potassium: 4.1 mmol/L (ref 3.5–5.1)
Sodium: 137 mmol/L (ref 135–145)

## 2021-04-10 LAB — RESP PANEL BY RT-PCR (FLU A&B, COVID) ARPGX2
Influenza A by PCR: NEGATIVE
Influenza B by PCR: NEGATIVE
SARS Coronavirus 2 by RT PCR: NEGATIVE

## 2021-04-10 LAB — MAGNESIUM: Magnesium: 1.9 mg/dL (ref 1.7–2.4)

## 2021-04-10 MED ORDER — GABAPENTIN 100 MG PO CAPS
100.0000 mg | ORAL_CAPSULE | Freq: Two times a day (BID) | ORAL | Status: DC
  Administered 2021-04-10 – 2021-04-13 (×7): 100 mg via ORAL
  Filled 2021-04-10 (×12): qty 1

## 2021-04-10 MED ORDER — FLUOXETINE HCL 20 MG PO CAPS: 20.0000 mg | ORAL_CAPSULE | Freq: Every day | ORAL | 3 refills | Status: DC

## 2021-04-10 MED ORDER — CHLORDIAZEPOXIDE HCL 25 MG PO CAPS: 25.0000 mg | ORAL_CAPSULE | Freq: Four times a day (QID) | ORAL | Status: DC | PRN

## 2021-04-10 MED ORDER — FLUOXETINE HCL 20 MG PO CAPS
20.0000 mg | ORAL_CAPSULE | Freq: Every day | ORAL | Status: DC
  Administered 2021-04-11: 20 mg via ORAL
  Filled 2021-04-10 (×3): qty 1

## 2021-04-10 MED ORDER — FOLIC ACID 1 MG PO TABS: 1.0000 mg | ORAL_TABLET | Freq: Every day | ORAL | Status: DC

## 2021-04-10 MED ORDER — MAGNESIUM HYDROXIDE 400 MG/5ML PO SUSP
30.0000 mL | Freq: Every day | ORAL | Status: DC | PRN
  Administered 2021-04-13: 30 mL via ORAL
  Filled 2021-04-10: qty 30

## 2021-04-10 MED ORDER — LISINOPRIL 40 MG PO TABS: 40.0000 mg | ORAL_TABLET | Freq: Every day | ORAL | 1 refills | Status: DC

## 2021-04-10 MED ORDER — ACETAMINOPHEN 325 MG PO TABS: 650.0000 mg | ORAL_TABLET | Freq: Four times a day (QID) | ORAL | Status: DC | PRN

## 2021-04-10 MED ORDER — ADULT MULTIVITAMIN W/MINERALS CH: 1.0000 | ORAL_TABLET | Freq: Every day | ORAL | Status: DC

## 2021-04-10 MED ORDER — ADULT MULTIVITAMIN W/MINERALS CH
1.0000 | ORAL_TABLET | Freq: Every day | ORAL | Status: DC
  Administered 2021-04-11 – 2021-04-13 (×3): 1 via ORAL
  Filled 2021-04-10 (×6): qty 1

## 2021-04-10 MED ORDER — CHLORDIAZEPOXIDE HCL 25 MG PO CAPS: 25.0000 mg | ORAL_CAPSULE | ORAL | Status: DC

## 2021-04-10 MED ORDER — HYDROXYZINE HCL 25 MG PO TABS
25.0000 mg | ORAL_TABLET | Freq: Four times a day (QID) | ORAL | Status: AC | PRN
  Administered 2021-04-11 – 2021-04-12 (×2): 25 mg via ORAL
  Filled 2021-04-10 (×2): qty 1

## 2021-04-10 MED ORDER — TRAZODONE HCL 50 MG PO TABS
50.0000 mg | ORAL_TABLET | Freq: Every day | ORAL | Status: DC
  Administered 2021-04-11 – 2021-04-13 (×3): 50 mg via ORAL
  Filled 2021-04-10 (×6): qty 1

## 2021-04-10 MED ORDER — GABAPENTIN 100 MG PO CAPS: 100.0000 mg | ORAL_CAPSULE | Freq: Two times a day (BID) | ORAL | Status: DC

## 2021-04-10 MED ORDER — CHLORDIAZEPOXIDE HCL 25 MG PO CAPS: 25.0000 mg | ORAL_CAPSULE | ORAL | 0 refills | Status: DC

## 2021-04-10 MED ORDER — THIAMINE HCL 100 MG PO TABS
100.0000 mg | ORAL_TABLET | Freq: Every day | ORAL | Status: DC
  Administered 2021-04-11 – 2021-04-13 (×3): 100 mg via ORAL
  Filled 2021-04-10 (×6): qty 1

## 2021-04-10 MED ORDER — CHLORDIAZEPOXIDE HCL 25 MG PO CAPS
25.0000 mg | ORAL_CAPSULE | Freq: Three times a day (TID) | ORAL | Status: DC
  Administered 2021-04-11: 25 mg via ORAL
  Filled 2021-04-10: qty 1

## 2021-04-10 MED ORDER — ALUM & MAG HYDROXIDE-SIMETH 200-200-20 MG/5ML PO SUSP
30.0000 mL | ORAL | Status: DC | PRN
  Administered 2021-04-11 – 2021-04-12 (×3): 30 mL via ORAL
  Filled 2021-04-10 (×3): qty 30

## 2021-04-10 MED ORDER — THIAMINE HCL 100 MG PO TABS: 100.0000 mg | ORAL_TABLET | Freq: Every day | ORAL | Status: AC

## 2021-04-10 MED ORDER — ATENOLOL 50 MG PO TABS
50.0000 mg | ORAL_TABLET | Freq: Every day | ORAL | Status: DC
  Administered 2021-04-11 – 2021-04-13 (×3): 50 mg via ORAL
  Filled 2021-04-10 (×3): qty 1
  Filled 2021-04-10: qty 2
  Filled 2021-04-10 (×2): qty 1

## 2021-04-10 MED ORDER — CHLORDIAZEPOXIDE HCL 25 MG PO CAPS: 25.0000 mg | ORAL_CAPSULE | Freq: Four times a day (QID) | ORAL | 0 refills | Status: DC

## 2021-04-10 MED ORDER — AMLODIPINE BESYLATE 10 MG PO TABS
10.0000 mg | ORAL_TABLET | Freq: Every day | ORAL | Status: DC
  Administered 2021-04-11 – 2021-04-13 (×3): 10 mg via ORAL
  Filled 2021-04-10 (×6): qty 1

## 2021-04-10 MED ORDER — CHLORDIAZEPOXIDE HCL 25 MG PO CAPS: 25.0000 mg | ORAL_CAPSULE | Freq: Every day | ORAL | 0 refills | Status: DC

## 2021-04-10 MED ORDER — CHLORDIAZEPOXIDE HCL 25 MG PO CAPS: 25.0000 mg | ORAL_CAPSULE | Freq: Four times a day (QID) | ORAL | 0 refills | Status: DC | PRN

## 2021-04-10 MED ORDER — HYDROXYZINE HCL 25 MG PO TABS: 25.0000 mg | ORAL_TABLET | Freq: Four times a day (QID) | ORAL | 0 refills | Status: DC | PRN

## 2021-04-10 MED ORDER — CHLORDIAZEPOXIDE HCL 25 MG PO CAPS: 25.0000 mg | ORAL_CAPSULE | Freq: Every day | ORAL | Status: DC

## 2021-04-10 MED ORDER — ACETAMINOPHEN 325 MG PO TABS
650.0000 mg | ORAL_TABLET | Freq: Four times a day (QID) | ORAL | Status: DC | PRN
  Administered 2021-04-10: 650 mg via ORAL
  Filled 2021-04-10: qty 2

## 2021-04-10 MED ORDER — THIAMINE HCL 100 MG/ML IJ SOLN: 100.0000 mg | Freq: Every day | INTRAMUSCULAR | Status: DC

## 2021-04-10 MED ORDER — CHLORDIAZEPOXIDE HCL 25 MG PO CAPS: 25.0000 mg | ORAL_CAPSULE | Freq: Three times a day (TID) | ORAL | 0 refills | Status: DC

## 2021-04-10 MED ORDER — FOLIC ACID 1 MG PO TABS
1.0000 mg | ORAL_TABLET | Freq: Every day | ORAL | Status: DC
  Administered 2021-04-11 – 2021-04-13 (×3): 1 mg via ORAL
  Filled 2021-04-10 (×5): qty 1

## 2021-04-10 MED ORDER — CHLORDIAZEPOXIDE HCL 25 MG PO CAPS
25.0000 mg | ORAL_CAPSULE | Freq: Four times a day (QID) | ORAL | Status: AC
  Administered 2021-04-10 (×2): 25 mg via ORAL
  Filled 2021-04-10 (×2): qty 1

## 2021-04-10 MED ORDER — ROSUVASTATIN CALCIUM 40 MG PO TABS
40.0000 mg | ORAL_TABLET | Freq: Every day | ORAL | Status: DC
  Administered 2021-04-11 – 2021-04-13 (×3): 40 mg via ORAL
  Filled 2021-04-10 (×5): qty 1

## 2021-04-10 MED ORDER — LISINOPRIL 40 MG PO TABS
40.0000 mg | ORAL_TABLET | Freq: Every day | ORAL | Status: DC
  Administered 2021-04-11 – 2021-04-13 (×3): 40 mg via ORAL
  Filled 2021-04-10: qty 1
  Filled 2021-04-10: qty 2
  Filled 2021-04-10 (×4): qty 1

## 2021-04-10 NOTE — Discharge Summary (Signed)
Physician Discharge Summary  Victoria FlockCarol Reicher WUJ:[id number] DOB: Oct 26, 1966 DOA: 04/07/2021  PCP: Pcp, No  Admit date: 04/07/2021 Discharge date: 04/10/2021  Time spent: 50 minutes  Recommendations for Outpatient Follow-up:  Patient will be discharged to inpatient psychiat[facility] at [facility].   Discharge Diagnoses:  Principal Problem:   Major depressive disorder, single episode, severe (HCC) Active Problems:   Drug overdose   Essential hypertension   Suicidal ideation   Hypokalemia   Alcohol abuse   Alcohol use disorder, severe, dependence (HCC)   Hypothyroidism   QT prolongation   Ingestion of substance   Hypomagnesemia   Discharge Condition: Stable  Diet recommendation: Regular  Filed Weights   04/07/21 1650  Weight: 89.4 kg    History of present illness:  HPI per Dr. Manning CharityKakrakandy Victoria Patel is a 55 y.o. female with history of hypertension, hypothyroidism, depression intentionally overdosed with 14 pills of Advil PM 1 day prior to admission around 10 AM.  Following which patient's husband brought her [facility] and was [facility] for medical [facility].  Patient stated she has been depressed and suicidal.  Denied any chest pain shortness of breath.  Did drink alcohol but denied taking any Tylenol or overdosing with any other drugs.   ED Course: In the ER patient's labs show potassium of 2.8 and EKG initially showed QT C of 4 885 ms which repeated showed worsening to 615.  Magnesium also is low at 1.5.  Poison control at this time recommended correcting potassium magnesium and making sure that the QTC is less than 500 before medical clearance.  Patient vital signs has to be noted also.  Hospital Course:  1 intentional drug overdose with Advil PM/suicidal ideation -Patient had presented with intentional overdose on Advil PM x14 pills.  Poison control advised follow-up on EKG for QTC prolongation after correction of electrolytes. -Electrolytes  corrected. -QTC prolongation resolved. -Psychiatry consulted and patient started on gabapentin and Prozac and recommended inpatient psychiatric admission. -Per psychiatry if patient refuses inpatient psychiatric admission will need IVC. -One-to-one sitter was placed during the hospitalization.   -Patient be discharged to inpatient psychiat[facility] at [facility] when bed available.  2.  Hypokalemia/hypomagnesemia -Likely secondary to diuretics as patient also noted to be on HCTZ.  -Repleted.  3.  Depression suicidal ideation on suicide precautions -Lexapro held on admission due to QTC prolongation and subsequently discontinued. -Patient seen in consultation by psychiatry and patient started on Prozac and gabapentin. -Psychiatry recommended inpatient psychiatric admission once medically stable. -Patient medically stable and will be discharged to inpatient psychiatric unit.  4.  QTC prolongation -Secondary to problem #1 -Resolved with correction of electrolyte abnormalities.  5.  Hypothyroidism -Patient maintained on home dose Synthroid.  6.  Alcohol abuse -Stable. -No signs of withdrawal. -Patient initially placed on Ativan withdrawal protocol, thiamine, folic acid and multivitamin.   -Patient subsequently transition to the Librium detox protocol.   -Patient had no significant signs of withdrawal during the hospitalization.   -Patient be discharged to inpatient psychiatric hospital.  7.  Hypertension -Lisinopril increased to 40 mg daily for better blood pressure control.  -Patient maintained on home regimen of atenolol and Norvasc.  -HCTZ was discontinued from blood pressure regimen.  -Outpatient follow-up with PCP.    8. Hyperlipidemia -Patient maintained on home regimen statin.  Procedures: None  Consultations: Psychiatry: Dr. Jannifer FranklinAkintayo 04/08/2021  Discharge Exam: Vitals:   04/10/21 0402 04/10/21 0815  BP: (!) 124/98 (!) 162/104  Pulse: 60 67  Resp: 15 16  Temp: 97.8 F (36.6 C)  97.7 F (36.5 C)  SpO2: 100% 98%    General: NAD.  Depressed.  Tearful. Cardiovascular: Regular rate rhythm no murmurs rubs or gallops.  No JVD.  No lower extremity edema. Respiratory: CTA B.  No wheezes, no crackles, no rhonchi.  Discharge Instructions   Discharge Instructions     Diet general   Complete by: As directed    Increase activity slowly   Complete by: As directed       Allergies as of 04/10/2021   No Known Allergies      Medication List     STOP taking these medications    disulfiram 250 MG tablet Commonly known as: ANTABUSE   escitalopram 20 MG tablet Commonly known as: LEXAPRO   lisinopril-hydrochlorothiazide 20-12.5 MG tablet Commonly known as: ZESTORETIC       TAKE these medications    amLODipine 10 MG tablet Commonly known as: NORVASC Take 10 mg by mouth daily.   atenolol 50 MG tablet Commonly known as: TENORMIN Take 50 mg by mouth daily.   chlordiazePOXIDE 25 MG capsule Commonly known as: LIBRIUM Take 1 capsule (25 mg total) by mouth every 6 (six) hours as needed for withdrawal (CIWA score > 10).   chlordiazePOXIDE 25 MG capsule Commonly known as: LIBRIUM Take 1 capsule (25 mg total) by mouth 4 (four) times daily.   chlordiazePOXIDE 25 MG capsule Commonly known as: LIBRIUM Take 1 capsule (25 mg total) by mouth 3 (three) times daily.   chlordiazePOXIDE 25 MG capsule Commonly known as: LIBRIUM Take 1 capsule (25 mg total) by mouth 2 (two) times daily in the am and at bedtime.. Start taking on: April 11, 2021   chlordiazePOXIDE 25 MG capsule Commonly known as: LIBRIUM Take 1 capsule (25 mg total) by mouth daily. Start taking on: April 12, 2021   diphenhydramine-acetaminophen 25-500 MG Tabs tablet Commonly known as: TYLENOL PM Take 2 tablets by mouth at bedtime.   FLUoxetine 20 MG capsule Commonly known as: PROZAC Take 1 capsule (20 mg total) by mouth daily. Start taking on: April 11, 2021   folic acid 1 MG tablet Commonly  known as: FOLVITE Take 1 tablet (1 mg total) by mouth daily. Start taking on: April 11, 2021   gabapentin 100 MG capsule Commonly known as: NEURONTIN Take 1 capsule (100 mg total) by mouth 2 (two) times daily.   hydrOXYzine 25 MG tablet Commonly known as: ATARAX/VISTARIL Take 1 tablet (25 mg total) by mouth every 6 (six) hours as needed for anxiety (or CIWA score </= 10).   levothyroxine 75 MCG tablet Commonly known as: SYNTHROID Take 75 mcg by mouth every morning.   lisinopril 40 MG tablet Commonly known as: ZESTRIL Take 1 tablet (40 mg total) by mouth daily. Start taking on: April 11, 2021   multivitamin with minerals Tabs tablet Take 1 tablet by mouth daily. Start taking on: April 11, 2021   NexIUM 24HR 20 MG Tbec Generic drug: Esomeprazole Magnesium Take 20 mg by mouth daily.   OVER THE COUNTER MEDICATION Place 1 drop into both eyes daily as needed (dry eyes). Dry eye drop   rosuvastatin 40 MG tablet Commonly known as: CRESTOR Take 40 mg by mouth daily.   thiamine 100 MG tablet Take 1 tablet (100 mg total) by mouth daily. Start taking on: April 11, 2021       No Known Allergies  Follow-up Information     MD at [facility] Follow  up.                   The results of significant diagnostics from this hospitalization (including imaging, microbiology, ancillary and laboratory) are listed below for reference.    Significant Diagnostic Studies: No results found.  Microbiology: Recent Results (from the past 240 hour(s))  Resp Panel by RT-PCR (Flu A&B, Covid) Urine, Clean Catch     Status: None   Collection Time: 04/07/21  4:58 PM   Specimen: Urine, Clean Catch; Nasopharyngeal(NP) swabs in vial transport medium  Result Value Ref Range Status   SARS Coronavirus 2 by RT PCR NEGATIVE NEGATIVE Final    Comment: (NOTE) SARS-CoV-2 target nucleic acids are NOT DETECTED.  The SARS-CoV-2 RNA is generally detectable in upper respiratory specimens during the acute phase  of infection. The lowest concentration of SARS-CoV-2 viral copies this assay can detect is 138 copies/mL. A negative result does not preclude SARS-Cov-2 infection and should not be used as the sole basis for treatment or other patient management decisions. A negative result may occur with  improper specimen collection/handling, submission of specimen other than nasopharyngeal swab, presence of viral mutation(s) within the areas targeted by this assay, and inadequate number of viral copies(<138 copies/mL). A negative result must be combined with clinical observations, patient history, and epidemiological information. The expected result is Negative.  Fact Sheet for Patients:  [employer]Course.com  Fact Sheet for Healthcare Providers:  SeriousBroker.it  This test is no t yet approved or cleared by the [place] FDA and  has been authorized for detection and/or diagnosis of SARS-CoV-2 by FDA under an Emergency Use Authorization (EUA). This EUA will remain  in effect (meaning this test can be used) for the duration of the COVID-19 declaration under Section 564(b)(1) of the Act, 21 U.S.C.section 360bbb-3(b)(1), unless the authorization is terminated  or revoked sooner.       Influenza A by PCR NEGATIVE NEGATIVE Final   Influenza B by PCR NEGATIVE NEGATIVE Final    Comment: (NOTE) The Xpert Xpress SARS-CoV-2/FLU/RSV plus assay is intended as an aid in the diagnosis of influenza from Nasopharyngeal swab specimens and should not be used as a sole basis for treatment. Nasal washings and aspirates are unacceptable for Xpert Xpress SARS-CoV-2/FLU/RSV testing.  Fact Sheet for Patients: BloggerCourse.com  Fact Sheet for Healthcare Providers: SeriousBroker.it  This test is not yet approved or cleared by the [place] FDA and has been authorized for detection and/or diagnosis of  SARS-CoV-2 by FDA under an Emergency Use Authorization (EUA). This EUA will remain in effect (meaning this test can be used) for the duration of the COVID-19 declaration under Section 564(b)(1) of the Act, 21 U.S.C. section 360bbb-3(b)(1), unless the authorization is terminated or revoked.  Performed at [facility], [address], [place], [place] [address]      Labs: Basic Metabolic Panel: Recent Labs  Lab 04/07/21 1658 04/07/21 2028 04/08/21 0612 04/09/21 0029 04/10/21 0256  NA 139 140 140 137 137  K 2.8* 3.0* 3.6 3.9 4.1  CL 103 106 104 104 103  CO2 23 22 27 28 28   GLUCOSE 84 142* 87 87 95  BUN 13 12 11 11 12   CREATININE 0.92 0.82 0.75 0.83 0.90  CALCIUM 9.5 9.5 9.5 9.9 10.2  MG  --  1.5* 2.1 2.0 1.9   Liver Function Tests: Recent Labs  Lab 04/07/21 1658  AST 49*  ALT 45*  ALKPHOS 39  BILITOT 0.6  PROT 6.2*  ALBUMIN  3.6   No results for input(s): LIPASE, AMYLASE in the last 168 hours. No results for input(s): AMMONIA in the last 168 hours. CBC: Recent Labs  Lab 04/07/21 1658 04/09/21 0029  WBC 6.9 5.7  NEUTROABS 3.4 2.7  HGB 13.2 12.5  HCT 39.0 37.2  MCV 98.7 98.9  PLT 232 210   Cardiac Enzymes: No results for input(s): CKTOTAL, CKMB, CKMBINDEX, TROPONINI in the last 168 hours. BNP: BNP (last 3 results) No results for input(s): BNP in the last 8760 hours.  ProBNP (last 3 results) No results for input(s): PROBNP in the last 8760 hours.  CBG: No results for input(s): GLUCAP in the last 168 hours.     Signed:  Ramiro Harvest MD.  [facility] 04/10/2021, 11:58 AM

## 2021-04-10 NOTE — Plan of Care (Signed)
  Problem: Clinical Measurements: Goal: Respiratory complications will improve Outcome: Progressing   Problem: Clinical Measurements: Goal: Cardiovascular complication will be avoided Outcome: Progressing   Problem: Activity: Goal: Risk for activity intolerance will decrease Outcome: Progressing   Problem: Nutrition: Goal: Adequate nutrition will be maintained Outcome: Progressing   

## 2021-04-10 NOTE — TOC Transition Note (Signed)
Transition of Care [facility]) - CM/SW Discharge Note   Patient Details  Name: [name] MRN: [id number] Date of Birth: 1965-11-24  [facility] CM/SW Contact:  Mearl Latin, LCSW Phone Number: 04/10/2021, 2:31 PM   Clinical Narrative:    Patient will DC to: [facility] Anticipated DC date: 04/10/21 Family notified: Pt declined for CSW to notify family Transport by: Safe transport 3pm   Per MD patient ready for DC to [facility]. RN to call report prior to discharge [phone] bed [phone]). RN, patient, and facility notified of DC. Original voluntary form on hard chart.  CSW will sign off for now as social work intervention is no longer needed. Please consult [place] again if new needs arise.     Final next level of care: Psychiatric Hospital Barriers to Discharge: No Barriers Identified   Patient Goals and CMS Choice        Discharge Placement                       Discharge Plan and Services In-house Referral: Clinical Social Work                                   Social Determinants of Health (SDOH) Interventions     Readmission Risk Interventions No flowsheet data found.

## 2021-04-10 NOTE — Progress Notes (Addendum)
CSW spoke with Linsey at [facility]. She may have beds opening tonight; she is requesting updated COVID test and repeat vitals as she noted patient's BP was high. CSW alerted MD and RN.   CSW spoke with patient; she was very tearful but declined to talk about what was upsetting her. She signed [facility] voluntary form. CSW faxed it to [facility].   Joaquin Courts, MSW, [facility]

## 2021-04-10 NOTE — Progress Notes (Signed)
Pt admitted voluntarily to [facility] adult inpatient unit.  Pt reported feelings of hopelessness d/t recent move to [place] from [place], not knowing anyone here, her cat is dying and heavily drinking daily.  Pt reported she feels as though she is letting her family down.  Pt admitted d/t suicidal ideation and an attempt to OD on advil.  Per report from transferring facility, this morning patient had wrapped telemetry cords around her neck.  Pt continued to endorse suicidal ideation during admission but verbally contracts for safety.  Pt reports 6 years sobriety in 1997.  She also reports being in rehab for 90 days 2 years ago.  Pt also report she recently began cutting.  Pt denied HI and AVH.  Fifteen minute checks initiated for patient safety.  Pt safe on unit.

## 2021-04-10 NOTE — Progress Notes (Signed)
Psychoeducational Group Note  Date:  04/10/2021 Time:  2053  Group Topic/Focus:  Wrap-Up Group:   The focus of this group is to help patients review their daily goal of treatment and discuss progress on daily workbooks.  Participation Level: Did Not Attend  Participation Quality:  Not Applicable  Affect:  Not Applicable  Cognitive:  Not Applicable  Insight:  Not Applicable  Engagement in Group: Not Applicable  Additional Comments:  The patient did not attend group this evening.   Hazle Coca S 04/10/2021, 8:53 PM

## 2021-04-11 DIAGNOSIS — R45851 Suicidal ideations: Secondary | ICD-10-CM

## 2021-04-11 LAB — TSH: TSH: 1.796 u[IU]/mL (ref 0.350–4.500)

## 2021-04-11 MED ORDER — CLONIDINE HCL 0.1 MG PO TABS: 0.1000 mg | ORAL_TABLET | Freq: Three times a day (TID) | ORAL | Status: DC | PRN

## 2021-04-11 MED ORDER — ESCITALOPRAM OXALATE 5 MG PO TABS
5.0000 mg | ORAL_TABLET | ORAL | Status: AC
  Administered 2021-04-11: 5 mg via ORAL
  Filled 2021-04-11: qty 1

## 2021-04-11 MED ORDER — LORAZEPAM 1 MG PO TABS: 1.0000 mg | ORAL_TABLET | Freq: Four times a day (QID) | ORAL | Status: DC | PRN

## 2021-04-11 MED ORDER — LORAZEPAM 1 MG PO TABS
1.0000 mg | ORAL_TABLET | ORAL | Status: AC
  Administered 2021-04-11: 1 mg via ORAL
  Filled 2021-04-11: qty 1

## 2021-04-11 NOTE — H&P (Signed)
Psychiatric Admission Assessment Adult  Patient Identification: Victoria FlockCarol Mooradian MRN:  [id number] Date of Evaluation:  04/11/2021 Chief Complaint:  Schizoaffective disorder (HCC) [F25.9] Principal Diagnosis: Suicidal ideation Diagnosis:  Principal Problem:   Suicidal ideation Active Problems:   Drug overdose   Major depressive disorder, single episode, severe (HCC)   Alcohol use disorder, severe, dependence (HCC)   Schizoaffective disorder (HCC)  History of Present Illness: Patient is seen and examined.  Patient is a 55 year old female with a past psychiatric history significant for alcohol dependence who [facility]21.  The patient and her husband stated that she had drank approximately half a liter of vodka over the last 24 hours.  She also consumed too many bottles and took approximately 14 Advil tablets.  She was sent to the emergency room for evaluation.  She was admitted to the hospital for medical reasons.  She was discharged and transferred to our facility on 04/10/2021.  Part of the reason for being in the medical hospital included a low potassium at 2.8 and a QTC prolongation of 488.  The patient has a longstanding history of alcoholism, and has been treated with Antabuse in the past.  She denied suicidal ideation currently, and does not wish to go to a rehabilitation center after discharge from our facility.  Associated Signs/Symptoms: Depression Symptoms:  depressed mood, anhedonia, insomnia, psychomotor agitation, fatigue, feelings of worthlessness/guilt, difficulty concentrating, hopelessness, suicidal thoughts with specific plan, anxiety, Duration of Depression Symptoms: Greater than two weeks  (Hypo) Manic Symptoms:  Impulsivity, Irritable Mood, Labiality of Mood, Anxiety Symptoms:  Excessive Worry, Psychotic Symptoms:   Denied PTSD Symptoms: Negative Total Time spent with patient: 30 minutes  Past  Psychiatric History: Patient has been admitted for alcohol related reasons several times in the past.  She stated that she had just had a rehabilitation stay in [place] last year.  Alcohol has been a major problem for her.  Is the patient at risk to self? Yes.    Has the patient been a risk to self in the past 6 months? Yes.    Has the patient been a risk to self within the distant past? No.  Is the patient a risk to others? No.  Has the patient been a risk to others in the past 6 months? No.  Has the patient been a risk to others within the distant past? No.   Prior Inpatient Therapy:   Prior Outpatient Therapy:    Alcohol Screening: 1. How often do you have a drink containing alcohol?: 4 or more times a week 2. How many drinks containing alcohol do you have on a typical day when you are drinking?: 7, 8, or 9 3. How often do you have six or more drinks on one occasion?: Daily or almost daily AUDIT-C Score: 11 4. How often during the last year have you found that you were not able to stop drinking once you had started?: Weekly 5. How often during the last year have you failed to do what was normally expected from you because of drinking?: Never 7. How often during the last year have you had a feeling of guilt of remorse after drinking?: Daily or almost daily 8. How often during the last year have you been unable to remember what happened the night before because you had been drinking?: Never 9. Have you or someone else been injured as a result of your drinking?: No 10. Has a relative or friend  or a doctor or another health worker been concerned about your drinking or suggested you cut down?: Yes, during the last year Alcohol Use Disorder Identification Test Final Score (AUDIT): 22 Alcohol Brief Interventions/Follow-up: Alcohol education/Brief advice Substance Abuse History in the last 12 months:  Yes.   Consequences of Substance Abuse: Medical Consequences:    The reason for this  hospitalization in addition to her overdose Previous Psychotropic Medications: Yes  Psychological Evaluations: Yes  Past Medical History:  Past Medical History:  Diagnosis Date   Anxiety    Depression    GERD (gastroesophageal reflux disease)    HTN (hypertension)    Hypothyroidism    History reviewed. No pertinent surgical history. Family History: History reviewed. No pertinent family history. Family Psychiatric  History: Noncontributory Tobacco Screening:   Social History:  Social History   Substance and Sexual Activity  Alcohol Use Yes   Comment: pint a day     Social History   Substance and Sexual Activity  Drug Use Not Currently    Additional Social History: Marital status: Married Number of Years Married:  (not reported) What types of issues is patient dealing with in the relationship?: none other than patient alcohol use Additional relationship information: Patient husband is supportive in her going to rehab. Are you sexually active?: Yes What is your sexual orientation?: straight Has your sexual activity been affected by drugs, alcohol, medication, or emotional stress?: no Does patient have children?: Yes How many children?: 2 (21 year old and 25 year old children) How is patient's relationship with their children?: supportive, good                         Allergies:  No Known Allergies Lab Results:  Results for orders placed or performed during the hospital encounter of 04/07/21 (from the past 48 hour(s))  Basic metabolic panel     Status: None   Collection Time: 04/10/21  2:56 AM  Result Value Ref Range   Sodium 137 135 - 145 mmol/L   Potassium 4.1 3.5 - 5.1 mmol/L   Chloride 103 98 - 111 mmol/L   CO2 28 22 - 32 mmol/L   Glucose, Bld 95 70 - 99 mg/dL    Comment: Glucose reference range applies only to samples taken after fasting for at least 8 hours.   BUN 12 6 - 20 mg/dL   Creatinine, Ser 9.60 0.44 - 1.00 mg/dL   Calcium 45.4 8.9 - 09.8 mg/dL    GFR, Estimated >11 >91 mL/min    Comment: (NOTE) Calculated using the CKD-EPI Creatinine Equation (2021)    Anion gap 6 5 - 15    Comment: Performed at [facility], [address] [place], [place] [address]  Magnesium     Status: None   Collection Time: 04/10/21  2:56 AM  Result Value Ref Range   Magnesium 1.9 1.7 - 2.4 mg/dL    Comment: Performed at [facility], [address] [place], [place] [address]  Resp Panel by RT-PCR (Flu A&B, Covid) Nasopharyngeal Swab     Status: None   Collection Time: 04/10/21 10:19 AM   Specimen: Nasopharyngeal Swab; Nasopharyngeal(NP) swabs in vial transport medium  Result Value Ref Range   SARS Coronavirus 2 by RT PCR NEGATIVE NEGATIVE    Comment: (NOTE) SARS-CoV-2 target nucleic acids are NOT DETECTED.  The SARS-CoV-2 RNA is generally detectable in upper respiratory specimens during the acute phase of infection. The lowest  concentration of SARS-CoV-2 viral copies this assay can detect is 138 copies/mL. A negative result does not preclude SARS-Cov-2 infection and should not be used as the sole basis for treatment or other patient management decisions. A negative result may occur with  improper specimen collection/handling, submission of specimen other than nasopharyngeal swab, presence of viral mutation(s) within the areas targeted by this assay, and inadequate number of viral copies(<138 copies/mL). A negative result must be combined with clinical observations, patient history, and epidemiological information. The expected result is Negative.  Fact Sheet for Patients:  BloggerCourse.com  Fact Sheet for Healthcare Providers:  SeriousBroker.it  This test is no t yet approved or cleared by the [place] FDA and  has been authorized for detection and/or diagnosis of SARS-CoV-2 by FDA under an Emergency Use Authorization (EUA). This EUA will remain  in effect (meaning this  test can be used) for the duration of the COVID-19 declaration under Section 564(b)(1) of the Act, 21 U.S.C.section 360bbb-3(b)(1), unless the authorization is terminated  or revoked sooner.       Influenza A by PCR NEGATIVE NEGATIVE   Influenza B by PCR NEGATIVE NEGATIVE    Comment: (NOTE) The Xpert Xpress SARS-CoV-2/FLU/RSV plus assay is intended as an aid in the diagnosis of influenza from Nasopharyngeal swab specimens and should not be used as a sole basis for treatment. Nasal washings and aspirates are unacceptable for Xpert Xpress SARS-CoV-2/FLU/RSV testing.  Fact Sheet for Patients: BloggerCourse.com  Fact Sheet for Healthcare Providers: SeriousBroker.it  This test is not yet approved or cleared by the [place] FDA and has been authorized for detection and/or diagnosis of SARS-CoV-2 by FDA under an Emergency Use Authorization (EUA). This EUA will remain in effect (meaning this test can be used) for the duration of the COVID-19 declaration under Section 564(b)(1) of the Act, 21 U.S.C. section 360bbb-3(b)(1), unless the authorization is terminated or revoked.  Performed at [facility], [address], [place], [place] [address]     Blood Alcohol level:  Lab Results  Component Value Date   ETH 184 (H) 04/07/2021    Metabolic Disorder Labs:  No results found for: HGBA1C, MPG No results found for: PROLACTIN No results found for: CHOL, TRIG, HDL, CHOLHDL, VLDL, LDLCALC  Current Medications: Current Facility-Administered Medications  Medication Dose Route Frequency Provider Last Rate Last Admin   acetaminophen (TYLENOL) tablet 650 mg  650 mg Oral Q6H PRN Starkes-Perry, Juel Burrow, FNP       alum & mag hydroxide-simeth (MAALOX/MYLANTA) 200-200-20 MG/5ML suspension 30 mL  30 mL Oral Q4H PRN Starkes-Perry, Juel Burrow, FNP       amLODipine (NORVASC) tablet 10 mg  10 mg Oral Daily Maryagnes Amos, FNP   10 mg  at 04/11/21 0744   atenolol (TENORMIN) tablet 50 mg  50 mg Oral Daily Maryagnes Amos, FNP   50 mg at 04/11/21 0744   cloNIDine (CATAPRES) tablet 0.1 mg  0.1 mg Oral TID PRN Antonieta Pert, MD       folic acid (FOLVITE) tablet 1 mg  1 mg Oral Daily Maryagnes Amos, FNP   1 mg at 04/11/21 1249   gabapentin (NEURONTIN) capsule 100 mg  100 mg Oral BID Maryagnes Amos, FNP   100 mg at 04/11/21 1626   hydrOXYzine (ATARAX/VISTARIL) tablet 25 mg  25 mg Oral Q6H PRN Maryagnes Amos, FNP       lisinopril (ZESTRIL) tablet 40 mg  40 mg Oral Daily Starkes-Perry,  Juel Burrow, FNP   40 mg at 04/11/21 0743   LORazepam (ATIVAN) tablet 1 mg  1 mg Oral Q6H PRN Antonieta Pert, MD       magnesium hydroxide (MILK OF MAGNESIA) suspension 30 mL  30 mL Oral Daily PRN Starkes-Perry, Juel Burrow, FNP       multivitamin with minerals tablet 1 tablet  1 tablet Oral Daily Maryagnes Amos, FNP   1 tablet at 04/11/21 0744   rosuvastatin (CRESTOR) tablet 40 mg  40 mg Oral Daily Maryagnes Amos, FNP   40 mg at 04/11/21 6295   thiamine tablet 100 mg  100 mg Oral Daily Maryagnes Amos, FNP   100 mg at 04/11/21 2841   Or   thiamine (B-1) injection 100 mg  100 mg Intravenous Daily Starkes-Perry, Juel Burrow, FNP       traZODone (DESYREL) tablet 50 mg  50 mg Oral QHS Ajibola, Ene A, NP       PTA Medications: Medications Prior to Admission  Medication Sig Dispense Refill Last Dose   amLODipine (NORVASC) 10 MG tablet Take 10 mg by mouth daily.      atenolol (TENORMIN) 50 MG tablet Take 50 mg by mouth daily.      chlordiazePOXIDE (LIBRIUM) 25 MG capsule Take 1 capsule (25 mg total) by mouth every 6 (six) hours as needed for withdrawal (CIWA score > 10). 30 capsule 0    chlordiazePOXIDE (LIBRIUM) 25 MG capsule Take 1 capsule (25 mg total) by mouth 4 (four) times daily. 30 capsule 0    chlordiazePOXIDE (LIBRIUM) 25 MG capsule Take 1 capsule (25 mg total) by mouth 3 (three) times daily. 30  capsule 0    chlordiazePOXIDE (LIBRIUM) 25 MG capsule Take 1 capsule (25 mg total) by mouth 2 (two) times daily in the am and at bedtime.. 30 capsule 0    [START ON 04/12/2021] chlordiazePOXIDE (LIBRIUM) 25 MG capsule Take 1 capsule (25 mg total) by mouth daily. 30 capsule 0    diphenhydramine-acetaminophen (TYLENOL PM) 25-500 MG TABS tablet Take 2 tablets by mouth at bedtime.      Esomeprazole Magnesium (NEXIUM 24HR) 20 MG TBEC Take 20 mg by mouth daily.      FLUoxetine (PROZAC) 20 MG capsule Take 1 capsule (20 mg total) by mouth daily.  3    folic acid (FOLVITE) 1 MG tablet Take 1 tablet (1 mg total) by mouth daily.      gabapentin (NEURONTIN) 100 MG capsule Take 1 capsule (100 mg total) by mouth 2 (two) times daily.      hydrOXYzine (ATARAX/VISTARIL) 25 MG tablet Take 1 tablet (25 mg total) by mouth every 6 (six) hours as needed for anxiety (or CIWA score </= 10). 30 tablet 0    levothyroxine (SYNTHROID) 75 MCG tablet Take 75 mcg by mouth every morning.      lisinopril (ZESTRIL) 40 MG tablet Take 1 tablet (40 mg total) by mouth daily. 30 tablet 1    Multiple Vitamin (MULTIVITAMIN WITH MINERALS) TABS tablet Take 1 tablet by mouth daily.      OVER THE COUNTER MEDICATION Place 1 drop into both eyes daily as needed (dry eyes). Dry eye drop      rosuvastatin (CRESTOR) 40 MG tablet Take 40 mg by mouth daily.      thiamine 100 MG tablet Take 1 tablet (100 mg total) by mouth daily.       Musculoskeletal: Strength & Muscle Tone: within normal limits Gait & Station:  normal Patient leans: N/A            Psychiatric Specialty Exam:  Presentation  General Appearance: Fairly Groomed  Eye Contact:Fair  Speech:Normal Rate  Speech Volume:Normal  Handedness:Right   Mood and Affect  Mood:Anxious  Affect:Appropriate   Thought Process  Thought Processes:Coherent  Duration of Psychotic Symptoms: No data recorded Past Diagnosis of Schizophrenia or Psychoactive disorder:  No  Descriptions of Associations:Intact  Orientation:Full (Time, Place and Person)  Thought Content:Logical  Hallucinations:Hallucinations: None  Ideas of Reference:None  Suicidal Thoughts:Suicidal Thoughts: No  Homicidal Thoughts:Homicidal Thoughts: No   Sensorium  Memory:Immediate Good; Recent Good; Remote Good  Judgment:Fair  Insight:Fair   Executive Functions  Concentration:Fair  Attention Span:Fair  Recall:Fair  Fund of Knowledge:Fair  Language:Good   Psychomotor Activity  Psychomotor Activity:Psychomotor Activity: Increased   Assets  Assets:Desire for Improvement; Resilience; Social Support; Health and safety inspector; Housing   Sleep  Sleep:Sleep: Good Number of Hours of Sleep: 6    Physical Exam: Physical Exam Vitals and nursing note reviewed.  Constitutional:      Appearance: Normal appearance.  Pulmonary:     Effort: Pulmonary effort is normal.  Neurological:     General: No focal deficit present.     Mental Status: She is alert and oriented to person, place, and time.   Review of Systems  All other systems reviewed and are negative. Blood pressure (!) 156/114, pulse 63, temperature 97.6 F (36.4 C), temperature source Oral, resp. rate 18, height 5\' 8"  (1.727 m), weight 88 kg, SpO2 98 %. Body mass index is 29.5 kg/m.  Treatment Plan Summary: Patient is seen and examined.  Patient is a 55 year old female with the above-stated past psychiatric history who was transferred to our facility after an intentional overdose of Advil in the presence of alcoholism.  She will be admitted to the hospital.  She will be integrated in the milieu.  She will be encouraged to attend groups.  Psychiatric consultation in the medical hospital changed her to fluoxetine, but she stated she does not like the way that that makes her feel.  We will repeat her EKG and see what her QTC prolongation is.  If stable with correction of her electrolytes we may restart  her previous antidepressant medications.  She does have a history of hypertension and is on Norvasc as well as atenolol.  We will go on and stop the Prozac for now.  We will continue lorazepam 1 mg p.o. every 6 hours as needed a CIWA greater than 10.  If necessary we will start the standing withdrawal protocol.  Review of her admission laboratories revealed normal sodium at 137, potassium 4.1.  The rest of her electrolytes were essentially normal except for a mildly elevated AST at 49 and ALT at 45.  Her creatinine was normal at 0.90 CBC was completely normal.  Differential was normal.  Acetaminophen was less than 10, salicylate less than 7.  Beta-hCG was negative.  Respiratory panel was negative.  HIV was negative.  Her blood alcohol on 04/07/2021 was 184.  Drug screen was positive for barbiturates.  The rest of the drug screen was negative.  Her EKG from 04/09/2021 showed a sinus rhythm with a QTc interval of 446.  Again, we will repeat that.  Her blood pressure remains elevated.  This morning is 156/114.  Pulse was 63.  Her most recent CIWA is not in the chart.  She did sleep 6 hours last night.  Observation Level/Precautions:  Detox 15 minute checks  Laboratory:  CBC Chemistry Profile HbAIC UDS  Psychotherapy:    Medications:    Consultations:    Discharge Concerns:    Estimated LOS:  Other:     Physician Treatment Plan for Primary Diagnosis: Suicidal ideation Long Term Goal(s): Improvement in symptoms so as ready for discharge  Short Term Goals: Ability to identify changes in lifestyle to reduce recurrence of condition will improve, Ability to verbalize feelings will improve, Ability to disclose and discuss suicidal ideas, Ability to demonstrate self-control will improve, Ability to identify and develop effective coping behaviors will improve, Ability to maintain clinical measurements within normal limits will improve, Compliance with prescribed medications will improve, and Ability to identify  triggers associated with substance abuse/mental health issues will improve  Physician Treatment Plan for Secondary Diagnosis: Principal Problem:   Suicidal ideation Active Problems:   Drug overdose   Major depressive disorder, single episode, severe (HCC)   Alcohol use disorder, severe, dependence (HCC)   Schizoaffective disorder (HCC)  Long Term Goal(s): Improvement in symptoms so as ready for discharge  Short Term Goals: Ability to identify changes in lifestyle to reduce recurrence of condition will improve, Ability to verbalize feelings will improve, Ability to disclose and discuss suicidal ideas, Ability to demonstrate self-control will improve, Ability to identify and develop effective coping behaviors will improve, Ability to maintain clinical measurements within normal limits will improve, Compliance with prescribed medications will improve, and Ability to identify triggers associated with substance abuse/mental health issues will improve  I certify that inpatient services furnished can reasonably be expected to improve the patient's condition.    Antonieta Pert, MD 6/14/20225:19 PM

## 2021-04-11 NOTE — Progress Notes (Signed)
Recreation Therapy Notes  Animal-Assisted Activity (AAA) Program Checklist/Progress Notes Patient Eligibility Criteria Checklist & Daily Group note for Rec Tx Intervention  Date: 6.14.22 Time: 1430 Location: [address] Dayroom   AAA/T Program Assumption of Risk Form signed by Patient/ or Parent Legal Guardian YES  Patient is free of allergies or severe asthma YES   Patient reports no fear of animals YES   Patient reports no history of cruelty to animals YES  Patient understands his/her participation is voluntary YES  Patient washes hands before animal contact YES  Patient washes hands after animal contact YES   Education: Hand Washing, Appropriate Animal Interaction   Education Outcome: Acknowledges understanding/In group clarification offered/Needs additional education.   Clinical Observations/Feedback: Pt did not attend activity.      Denice Cardon, LRT/CTRS        Wilmont Olund A 04/11/2021 3:42 PM 

## 2021-04-11 NOTE — Plan of Care (Signed)
  Problem: Education: Goal: Knowledge of  General Education information/materials will improve Outcome: Progressing Goal: Emotional status will improve Outcome: Progressing Goal: Mental status will improve Outcome: Progressing Goal: Verbalization of understanding the information provided will improve Outcome: Progressing   

## 2021-04-11 NOTE — [facility] Suicide Risk Assessment (Signed)
[facility] Admission Suicide Risk Assessment   Nursing information obtained from:  Patient Demographic factors:  Caucasian Current Mental Status:  Suicidal ideation indicated by patient Loss Factors:  NA Historical Factors:  NA Risk Reduction Factors:  Sense of responsibility to family, Living with another person, especially a relative  Total Time spent with patient: 30 minutes Principal Problem: Suicidal ideation Diagnosis:  Principal Problem:   Suicidal ideation Active Problems:   Drug overdose   Major depressive disorder, single episode, severe (HCC)   Alcohol use disorder, severe, dependence (HCC)   Schizoaffective disorder (HCC)  Subjective Data: Patient is seen and examined.  Patient is a 55 year old female with a past psychiatric history significant for alcohol dependence who [facility] on 04/07/2021.  The patient and her husband stated that she had drank approximately half a liter of vodka over the last 24 hours.  She also consumed too many bottles and took approximately 14 Advil tablets.  She was sent to the emergency room for evaluation.  She was admitted to the hospital for medical reasons.  She was discharged and transferred to our facility on 04/10/2021.  Part of the reason for being in the medical hospital included a low potassium at 2.8 and a QTC prolongation of 488.  The patient has a longstanding history of alcoholism, and has been treated with Antabuse in the past.  She denied suicidal ideation currently, and does not wish to go to a rehabilitation center after discharge from our facility.  Continued Clinical Symptoms:  Alcohol Use Disorder Identification Test Final Score (AUDIT): 22 The "Alcohol Use Disorders Identification Test", Guidelines for Use in Primary Care, Second Edition.  World Science writer [facility]). Score between 0-7:  no or low risk or alcohol related problems. Score between 8-15:  moderate risk of  alcohol related problems. Score between 16-19:  high risk of alcohol related problems. Score 20 or above:  warrants further diagnostic evaluation for alcohol dependence and treatment.   CLINICAL FACTORS:   Depression:   Anhedonia Comorbid alcohol abuse/dependence Hopelessness Impulsivity Insomnia Alcohol/Substance Abuse/Dependencies   Musculoskeletal: Strength & Muscle Tone: within normal limits Gait & Station: normal Patient leans: N/A  Psychiatric Specialty Exam:  Presentation  General Appearance: Fairly Groomed  Eye Contact:Fair  Speech:Normal Rate  Speech Volume:Normal  Handedness:Right   Mood and Affect  Mood:Anxious  Affect:Appropriate   Thought Process  Thought Processes:Coherent  Descriptions of Associations:Intact  Orientation:Full (Time, Place and Person)  Thought Content:Logical  History of Schizophrenia/Schizoaffective disorder:No  Duration of Psychotic Symptoms:No data recorded Hallucinations:Hallucinations: None  Ideas of Reference:None  Suicidal Thoughts:Suicidal Thoughts: No  Homicidal Thoughts:Homicidal Thoughts: No   Sensorium  Memory:Immediate Good; Recent Good; Remote Good  Judgment:Fair  Insight:Fair   Executive Functions  Concentration:Fair  Attention Span:Fair  Recall:Fair  Fund of Knowledge:Fair  Language:Good   Psychomotor Activity  Psychomotor Activity:Psychomotor Activity: Increased   Assets  Assets:Desire for Improvement; Resilience; Social Support; Health and safety inspector; Housing   Sleep  Sleep:Sleep: Good Number of Hours of Sleep: 6    Physical Exam: Physical Exam Vitals and nursing note reviewed.  Constitutional:      Appearance: Normal appearance.  HENT:     Head: Normocephalic and atraumatic.  Pulmonary:     Effort: Pulmonary effort is normal.  Neurological:     General: No focal deficit present.     Mental Status: She is alert and oriented to person, place, and time.    Review of Systems  All other systems  reviewed and are negative. Blood pressure (!) 156/114, pulse 63, temperature 97.6 F (36.4 C), temperature source Oral, resp. rate 18, height 5\' 8"  (1.727 m), weight 88 kg, SpO2 98 %. Body mass index is 29.5 kg/m.   COGNITIVE FEATURES THAT CONTRIBUTE TO RISK:  None    SUICIDE RISK:   Mild:  Suicidal ideation of limited frequency, intensity, duration, and specificity.  There are no identifiable plans, no associated intent, mild dysphoria and related symptoms, good self-control (both objective and subjective assessment), few other risk factors, and identifiable protective factors, including available and accessible social support.  PLAN OF CARE: Patient is seen and examined.  Patient is a 55 year old female with the above-stated past psychiatric history who was transferred to our facility after an intentional overdose of Advil in the presence of alcoholism.  She will be admitted to the hospital.  She will be integrated in the milieu.  She will be encouraged to attend groups.  Psychiatric consultation in the medical hospital changed her to fluoxetine, but she stated she does not like the way that that makes her feel.  We will repeat her EKG and see what her QTC prolongation is.  If stable with correction of her electrolytes we may restart her previous antidepressant medications.  She does have a history of hypertension and is on Norvasc as well as atenolol.  We will go on and stop the Prozac for now.  We will continue lorazepam 1 mg p.o. every 6 hours as needed a CIWA greater than 10.  If necessary we will start the standing withdrawal protocol.  Review of her admission laboratories revealed normal sodium at 137, potassium 4.1.  The rest of her electrolytes were essentially normal except for a mildly elevated AST at 49 and ALT at 45.  Her creatinine was normal at 0.90 CBC was completely normal.  Differential was normal.  Acetaminophen was less than 10, salicylate  less than 7.  Beta-hCG was negative.  Respiratory panel was negative.  HIV was negative.  Her blood alcohol on 04/07/2021 was 184.  Drug screen was positive for barbiturates.  The rest of the drug screen was negative.  Her EKG from 04/09/2021 showed a sinus rhythm with a QTc interval of 446.  Again, we will repeat that.  Her blood pressure remains elevated.  This morning is 156/114.  Pulse was 63.  Her most recent CIWA is not in the chart.  She did sleep 6 hours last night.  I certify that inpatient services furnished can reasonably be expected to improve the patient's condition.   06/09/2021, MD 04/11/2021, 12:36 PM

## 2021-04-11 NOTE — Progress Notes (Signed)
Progress note    04/11/21 0744  Psych Admission Type (Psych Patients Only)  Admission Status Voluntary  Psychosocial Assessment  Patient Complaints Anxiety  Eye Contact Fair  Facial Expression Anxious  Affect Anxious;Appropriate to circumstance  Speech Logical/coherent  Interaction Assertive  Motor Activity Slow  Appearance/Hygiene Unremarkable  Behavior Characteristics Cooperative;Appropriate to situation;Anxious  Mood Anxious;Pleasant  Thought Process  Coherency WDL  Content WDL  Delusions WDL  Perception WDL  Hallucination None reported or observed  Judgment Poor  Confusion None  Danger to Self  Current suicidal ideation? Denies  Danger to Others  Danger to Others None reported or observed

## 2021-04-11 NOTE — Progress Notes (Signed)
Adult Psychoeducational Group Note  Date:  04/11/2021 Time:  3:01 PM  Group Topic/Focus:  Goals Group:   The focus of this group is to help patients establish daily goals to achieve during treatment and discuss how the patient can incorporate goal setting into their daily lives to aide in recovery.  Participation Level:  Active  Participation Quality:  Appropriate and Attentive  Affect:  Appropriate  Cognitive:  Alert and Appropriate  Insight: Appropriate and Good  Engagement in Group:  Engaged  Modes of Intervention:  Discussion  Additional Comments:  Pt attended morning goals group. Pt had a goal of coming up with some coping skills for when she is angry.   Deforest Hoyles Hayzel Ruberg 04/11/2021, 3:01 PM

## 2021-04-11 NOTE — Progress Notes (Signed)
1:1 time spent with pt discussing drinking and ways to help stop for good. Pt seemed optimistic about moving forward in her Tx.     04/11/21 0000  Psych Admission Type (Psych Patients Only)  Admission Status Voluntary  Psychosocial Assessment  Patient Complaints Anxiety  Eye Contact Fair  Facial Expression Anxious  Affect Silly  Speech Logical/coherent  Interaction Assertive  Motor Activity Slow  Appearance/Hygiene Unremarkable  Behavior Characteristics Cooperative  Mood Pleasant  Aggressive Behavior  Effect No apparent injury  Thought Process  Coherency WDL  Content WDL  Delusions None reported or observed  Perception WDL  Hallucination None reported or observed  Judgment Poor  Confusion WDL  Danger to Self  Current suicidal ideation? Denies  Danger to Others  Danger to Others None reported or observed

## 2021-04-11 NOTE — Progress Notes (Signed)
Adult Psychoeducational Group Note  Date:  04/11/2021 Time:  5:00 PM  Group Topic/Focus:  Healthy Communication:   The focus of this group is to discuss communication, barriers to communication, as well as healthy ways to communicate with others.  Participation Level:  Active  Participation Quality:  Appropriate and Attentive  Affect:  Appropriate  Cognitive:  Alert and Appropriate  Insight: Appropriate and Good  Engagement in Group:  Engaged  Modes of Intervention:  Discussion  Additional Comments:  Pt attended and participated in the second group of the day on health communication.   Victoria Patel 04/11/2021, 5:00 PM
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
## 2021-04-12 DIAGNOSIS — F322 Major depressive disorder, single episode, severe without psychotic features: Principal | ICD-10-CM

## 2021-04-12 LAB — COMPREHENSIVE METABOLIC PANEL
ALT: 27 U/L (ref 0–44)
AST: 26 U/L (ref 15–41)
Albumin: 4 g/dL (ref 3.5–5.0)
Alkaline Phosphatase: 45 U/L (ref 38–126)
Anion gap: 8 (ref 5–15)
BUN: 19 mg/dL (ref 6–20)
CO2: 29 mmol/L (ref 22–32)
Calcium: 10.9 mg/dL — ABNORMAL HIGH (ref 8.9–10.3)
Chloride: 103 mmol/L (ref 98–111)
Creatinine, Ser: 0.92 mg/dL (ref 0.44–1.00)
GFR, Estimated: >60 mL/min (ref >60)
Glucose, Bld: 92 mg/dL (ref 70–99)
Potassium: 4 mmol/L (ref 3.5–5.1)
Sodium: 140 mmol/L (ref 135–145)
Total Bilirubin: 0.4 mg/dL (ref 0.3–1.2)
Total Protein: 7.1 g/dL (ref 6.5–8.1)

## 2021-04-12 MED ORDER — ESCITALOPRAM OXALATE 10 MG PO TABS
10.0000 mg | ORAL_TABLET | Freq: Every day | ORAL | Status: DC
  Administered 2021-04-12 – 2021-04-13 (×2): 10 mg via ORAL
  Filled 2021-04-12 (×6): qty 1

## 2021-04-12 MED ORDER — PANTOPRAZOLE SODIUM 40 MG PO TBEC
80.0000 mg | DELAYED_RELEASE_TABLET | Freq: Every day | ORAL | Status: DC
  Administered 2021-04-12 – 2021-04-13 (×2): 80 mg via ORAL
  Filled 2021-04-12 (×4): qty 2

## 2021-04-12 NOTE — Tx Team (Signed)
Interdisciplinary Treatment and Diagnostic Plan Update  04/12/2021 Time of Session: 10:15am  Victoria Patel MRN: [id number]  Principal Diagnosis: Suicidal ideation  Secondary Diagnoses: Principal Problem:   Suicidal ideation Active Problems:   Drug overdose   Major depressive disorder, single episode, severe (HCC)   Alcohol use disorder, severe, dependence ([place])   Schizoaffective disorder ([place])   Current Medications:  Current Facility-Administered Medications  Medication Dose Route Frequency Provider Last Rate Last Admin   acetaminophen (TYLENOL) tablet 650 mg  650 mg Oral Q6H PRN Starkes-Perry, Gayland Curry, FNP       alum & mag hydroxide-simeth (MAALOX/MYLANTA) 200-200-20 MG/5ML suspension 30 mL  30 mL Oral Q4H PRN Suella Broad, FNP   30 mL at 04/11/21 2113   amLODipine (NORVASC) tablet 10 mg  10 mg Oral Daily Suella Broad, FNP   10 mg at 04/12/21 0750   atenolol (TENORMIN) tablet 50 mg  50 mg Oral Daily Suella Broad, FNP   50 mg at 04/12/21 0751   cloNIDine (CATAPRES) tablet 0.1 mg  0.1 mg Oral TID PRN Sharma Covert, MD       escitalopram (LEXAPRO) tablet 10 mg  10 mg Oral Daily Sharma Covert, MD   10 mg at 07/86/75 4492   folic acid (FOLVITE) tablet 1 mg  1 mg Oral Daily Suella Broad, FNP   1 mg at 04/12/21 1251   gabapentin (NEURONTIN) capsule 100 mg  100 mg Oral BID Suella Broad, FNP   100 mg at 04/12/21 0750   hydrOXYzine (ATARAX/VISTARIL) tablet 25 mg  25 mg Oral Q6H PRN Suella Broad, FNP   25 mg at 04/11/21 2113   lisinopril (ZESTRIL) tablet 40 mg  40 mg Oral Daily Suella Broad, FNP   40 mg at 04/12/21 0751   LORazepam (ATIVAN) tablet 1 mg  1 mg Oral Q6H PRN Sharma Covert, MD       magnesium hydroxide (MILK OF MAGNESIA) suspension 30 mL  30 mL Oral Daily PRN Starkes-Perry, Gayland Curry, FNP       multivitamin with minerals tablet 1 tablet  1 tablet Oral Daily Suella Broad, FNP   1 tablet at  04/12/21 0750   rosuvastatin (CRESTOR) tablet 40 mg  40 mg Oral Daily Suella Broad, FNP   40 mg at 04/12/21 0750   thiamine tablet 100 mg  100 mg Oral Daily Suella Broad, FNP   100 mg at 04/12/21 0750   Or   thiamine (B-1) injection 100 mg  100 mg Intravenous Daily Starkes-Perry, Gayland Curry, FNP       traZODone (DESYREL) tablet 50 mg  50 mg Oral QHS [place], Ene A, NP   50 mg at 04/11/21 2113   PTA Medications: Medications Prior to Admission  Medication Sig Dispense Refill Last Dose   amLODipine (NORVASC) 10 MG tablet Take 10 mg by mouth daily.      atenolol (TENORMIN) 50 MG tablet Take 50 mg by mouth daily.      chlordiazePOXIDE (LIBRIUM) 25 MG capsule Take 1 capsule (25 mg total) by mouth every 6 (six) hours as needed for withdrawal (CIWA score > 10). 30 capsule 0    chlordiazePOXIDE (LIBRIUM) 25 MG capsule Take 1 capsule (25 mg total) by mouth 4 (four) times daily. 30 capsule 0    chlordiazePOXIDE (LIBRIUM) 25 MG capsule Take 1 capsule (25 mg total) by mouth 3 (three) times daily. 30 capsule 0    chlordiazePOXIDE (LIBRIUM)  25 MG capsule Take 1 capsule (25 mg total) by mouth 2 (two) times daily in the am and at bedtime.. 30 capsule 0    chlordiazePOXIDE (LIBRIUM) 25 MG capsule Take 1 capsule (25 mg total) by mouth daily. 30 capsule 0    diphenhydramine-acetaminophen (TYLENOL PM) 25-500 MG TABS tablet Take 2 tablets by mouth at bedtime.      Esomeprazole Magnesium (NEXIUM 24HR) 20 MG TBEC Take 20 mg by mouth daily.      FLUoxetine (PROZAC) 20 MG capsule Take 1 capsule (20 mg total) by mouth daily.  3    folic acid (FOLVITE) 1 MG tablet Take 1 tablet (1 mg total) by mouth daily.      gabapentin (NEURONTIN) 100 MG capsule Take 1 capsule (100 mg total) by mouth 2 (two) times daily.      hydrOXYzine (ATARAX/VISTARIL) 25 MG tablet Take 1 tablet (25 mg total) by mouth every 6 (six) hours as needed for anxiety (or CIWA score </= 10). 30 tablet 0    levothyroxine (SYNTHROID) 75 MCG  tablet Take 75 mcg by mouth every morning.      lisinopril (ZESTRIL) 40 MG tablet Take 1 tablet (40 mg total) by mouth daily. 30 tablet 1    Multiple Vitamin (MULTIVITAMIN WITH MINERALS) TABS tablet Take 1 tablet by mouth daily.      OVER THE COUNTER MEDICATION Place 1 drop into both eyes daily as needed (dry eyes). Dry eye drop      rosuvastatin (CRESTOR) 40 MG tablet Take 40 mg by mouth daily.      thiamine 100 MG tablet Take 1 tablet (100 mg total) by mouth daily.       Patient Stressors:    Patient Strengths:    Treatment Modalities: Medication Management, Group therapy, Case management,  1 to 1 session with clinician, Psychoeducation, Recreational therapy.   Physician Treatment Plan for Primary Diagnosis: Suicidal ideation Long Term Goal(s): Improvement in symptoms so as ready for discharge   Short Term Goals: Ability to identify changes in lifestyle to reduce recurrence of condition will improve Ability to verbalize feelings will improve Ability to disclose and discuss suicidal ideas Ability to demonstrate self-control will improve Ability to identify and develop effective coping behaviors will improve Ability to maintain clinical measurements within normal limits will improve Compliance with prescribed medications will improve Ability to identify triggers associated with substance abuse/mental health issues will improve  Medication Management: Evaluate patient's response, side effects, and tolerance of medication regimen.  Therapeutic Interventions: 1 to 1 sessions, Unit Group sessions and Medication administration.  Evaluation of Outcomes: Not Met  Physician Treatment Plan for Secondary Diagnosis: Principal Problem:   Suicidal ideation Active Problems:   Drug overdose   Major depressive disorder, single episode, severe (HCC)   Alcohol use disorder, severe, dependence ([place])   Schizoaffective disorder ([place])  Long Term Goal(s): Improvement in symptoms so as ready for  discharge   Short Term Goals: Ability to identify changes in lifestyle to reduce recurrence of condition will improve Ability to verbalize feelings will improve Ability to disclose and discuss suicidal ideas Ability to demonstrate self-control will improve Ability to identify and develop effective coping behaviors will improve Ability to maintain clinical measurements within normal limits will improve Compliance with prescribed medications will improve Ability to identify triggers associated with substance abuse/mental health issues will improve     Medication Management: Evaluate patient's response, side effects, and tolerance of medication regimen.  Therapeutic Interventions: 1 to 1 sessions, Unit  Group sessions and Medication administration.  Evaluation of Outcomes: Not Met   RN Treatment Plan for Primary Diagnosis: Suicidal ideation Long Term Goal(s): Knowledge of disease and therapeutic regimen to maintain health will improve  Short Term Goals: Ability to remain free from injury will improve, Ability to participate in decision making will improve, Ability to verbalize feelings will improve, Ability to disclose and discuss suicidal ideas, and Ability to identify and develop effective coping behaviors will improve  Medication Management: RN will administer medications as ordered by provider, will assess and evaluate patient's response and provide education to patient for prescribed medication. RN will report any adverse and/or side effects to prescribing provider.  Therapeutic Interventions: 1 on 1 counseling sessions, Psychoeducation, Medication administration, Evaluate responses to treatment, Monitor vital signs and CBGs as ordered, Perform/monitor CIWA, COWS, AIMS and Fall Risk screenings as ordered, Perform wound care treatments as ordered.  Evaluation of Outcomes: Not Met   LCSW Treatment Plan for Primary Diagnosis: Suicidal ideation Long Term Goal(s): Safe transition to  appropriate next level of care at discharge, Engage patient in therapeutic group addressing interpersonal concerns.  Short Term Goals: Engage patient in aftercare planning with referrals and resources, Increase social support, Increase emotional regulation, Facilitate acceptance of mental health diagnosis and concerns, Identify triggers associated with mental health/substance abuse issues, and Increase skills for wellness and recovery  Therapeutic Interventions: Assess for all discharge needs, 1 to 1 time with Social worker, Explore available resources and support systems, Assess for adequacy in community support network, Educate family and significant other(s) on suicide prevention, Complete Psychosocial Assessment, Interpersonal group therapy.  Evaluation of Outcomes: Not Met   Progress in Treatment: Attending groups: Yes. Participating in groups: Yes. Taking medication as prescribed: Yes. Toleration medication: Yes. Family/Significant other contact made: Yes, individual(s) contacted:  Husband  Patient understands diagnosis: Yes. and No. Discussing patient identified problems/goals with staff: Yes. Medical problems stabilized or resolved: Yes. Denies suicidal/homicidal ideation: Yes. Issues/concerns per patient self-inventory: No.   New problem(s) identified: No, Describe:  None   New Short Term/Long Term Goal(s): medication stabilization, elimination of SI thoughts, development of comprehensive mental wellness plan.   Patient Goals: "To gain coping skills and to get into a substance use treatment center"  Discharge Plan or Barriers: Patient recently admitted. CSW will continue to follow and assess for appropriate referrals and possible discharge planning.   Reason for Continuation of Hospitalization: Depression Medication stabilization Suicidal ideation Withdrawal symptoms  Estimated Length of Stay: 3 to 5 days   Attendees: Patient: Victoria Patel  04/12/2021   Physician: Myles Lipps, MD 04/12/2021   Nursing:  04/12/2021   RN Care Manager: 04/12/2021   Social Worker: Verdis Frederickson, [place]  04/12/2021   Recreational Therapist:  04/12/2021   Other:  04/12/2021   Other:  04/12/2021   Other: 04/12/2021     Scribe for Treatment Team: Darleen Crocker, [place] 04/12/2021 1:26 PM

## 2021-04-12 NOTE — Plan of Care (Signed)

## 2021-04-12 NOTE — Progress Notes (Signed)
Recreation Therapy Notes  Date: 6.15.22 Time: 0930 Location: [address] Dayroom  Group Topic: Stress Management   Goal Area(s) Addresses:  Patient will actively participate in stress management techniques presented during session.  Patient will successfully identify benefit of practicing stress management post d/c.   Behavioral Response: Appropriate  Intervention: Guided exercise with ambient sound and script  Activity :Guided Imagery  LRT provided education, instruction, and demonstration on practice of visualization via guided imagery. Patient was asked to participate in the technique introduced during session. LRT also debriefed including topics of mindfulness, stress management and specific scenarios each patient could use these techniques. Patients were given suggestions of ways to access scripts post d/c and encouraged to explore Youtube and other apps available on smartphones, tablets, and computers.   Education:  Stress Management, Discharge Planning.   Education Outcome: Acknowledges education  Clinical Observations/Feedback: Patient actively engaged in technique introduced, expressed no concerns.  Pt expressed the activity was "real relaxing".      Caroll Rancher, LRT/CTRS     Caroll Rancher A 04/12/2021 11:31 AM

## 2021-04-12 NOTE — [facility] Suicide Risk Assessment (Signed)
[facility] INPATIENT:  Family/Significant Other Suicide Prevention Education  Suicide Prevention Education:    Education Completed;  Tereasa Coop, patient husband, has been identified by the patient as the family member/significant other with whom the patient will be residing, and identified as the person(s) who will aid the patient in the event of a mental health crisis (suicidal ideations/suicide attempt).  With written consent from the patient, the family member/significant other has been provided the following suicide prevention education, prior to the and/or following the discharge of the patient.  Patient husband reports that patient has had an ongoing problem with alcoholism for many years.  Husband reports that there are triggers that will make the patient overindulge.  Husband believes that patient needs to get in a longer term program to assist with alcoholism.  Husband reports that they had plans to assist in getting into a program and patient had agreed last Thursday, 04/06/2021, that she would be up to going to a rehab. Husband reports that usually before agreeing to go into a program, patient will overindulge as one last attempt to have alcohol before attempting recovery. On Friday, husband was at work and received a message from his sister in law that patient was expressing that she was wanting to harm herself.  Husband came home to check in and they agreed to go to behavioral health hospital for an assessment. Patient was taking her time before getting in the car and being ready to go which husband found interesting. When patient finally got in the car, husband noticed that patient was deteriorating quickly.  Patient speech was slurred.  When arriving for assessment, patient admitted to taking 14 OTC pills and doing it intentially to harm herself. Husband was unaware that patient had taken OTC medication.    Husband would like to see patient admitted to longer term treatment for alcohol use. Husband  reports that he has done some research and believes he would like to look into [facility] as a potential rehab facility. Husband discussed that if patient can not be admitted as a direct admit, she could potentially come home but he feels like she would need supervision and she would need to be taken somewhere so they can supervise. Husband reports that he does not feel like he can trust her.  Husband also discussed that he could possible have patient sister from [place] come and stay while she waits for a bed at a longer term treatment program. Husband reports that there are no guns/weapons in the harm and medications would be locked up if she were to return home.   Husband also discussed that he has noticed that triggers to her drinking have included when she talks on the phone with her foster mom and children.   The suicide prevention education provided includes the following: Suicide risk factors Suicide prevention and interventions National Suicide Hotline telephone number [facility] assessment telephone number [facility] Emergency Assistance 911 [facility] and/or Residential Mobile Crisis Unit telephone number  Request made of family/significant other to: Remove weapons (e.g., guns, rifles, knives), all items previously/currently identified as safety concern.   Remove drugs/medications (over-the-counter, prescriptions, illicit drugs), all items previously/currently identified as a safety concern.  The family member/significant other verbalizes understanding of the suicide prevention education information provided.  The family member/significant other agrees to remove the items of safety concern listed above.    Victoria Patel E Victoria Patel 04/12/2021, 11:45 AM

## 2021-04-12 NOTE — Progress Notes (Signed)
Progress note    04/12/21 0750  Psych Admission Type (Psych Patients Only)  Admission Status Voluntary  Psychosocial Assessment  Patient Complaints Anxiety  Eye Contact Fair  Facial Expression Animated;Anxious  Affect Anxious;Appropriate to circumstance  Speech Logical/coherent  Interaction Assertive  Motor Activity Slow  Appearance/Hygiene Unremarkable  Behavior Characteristics Cooperative;Appropriate to situation;Anxious  Mood Anxious;Pleasant  Thought Process  Coherency WDL  Content WDL  Delusions None reported or observed  Perception WDL  Hallucination None reported or observed  Judgment Poor  Confusion None  Danger to Self  Current suicidal ideation? Denies  Danger to Others  Danger to Others None reported or observed

## 2021-04-12 NOTE — Progress Notes (Signed)
Pt visible on the unit this evening. Pt stated she was doing better. Pt plans to go to rehab at D/C.    04/12/21 0000  Psych Admission Type (Psych Patients Only)  Admission Status Voluntary  Psychosocial Assessment  Patient Complaints Anxiety  Eye Contact Fair  Facial Expression Anxious  Affect Anxious;Appropriate to circumstance  Speech Logical/coherent  Interaction Assertive  Motor Activity Slow  Appearance/Hygiene Unremarkable  Behavior Characteristics Cooperative  Mood Anxious;Pleasant  Thought Process  Coherency WDL  Content WDL  Delusions WDL  Perception WDL  Hallucination None reported or observed  Judgment Poor  Confusion None  Danger to Self  Current suicidal ideation? Denies  Danger to Others  Danger to Others None reported or observed

## 2021-04-12 NOTE — Progress Notes (Signed)
The patient rated her day as a 9 out of 10 and is complaining of bad heartburn. She states that the treatment  team has found a long-term drug treatment program for her.

## 2021-04-12 NOTE — Progress Notes (Signed)
[facility]  04/12/2021 6:19 PM Victoria Patel  MRN:  [id number]  Subjective: Victoria Patel reports, "I'm feeling better, but bored here.  Objective: Patient is a 55 year old female with a past psychiatric history significant for alcohol dependence who [facility].  The patient and her husband stated that she had drank approximately half a liter of vodka over the last 24 hours.  She also consumed too many bottles and took approximately 14 Advil tablets. Objective: Victoria Patel is seen, chart reviewed. The chart findings discussed with the treatment. She is alert, oriented & aware of situation. She is visible on the unit, attending group sessions. She says she is feeling better, but bored because there are not much activities to do here she says. She is attending group sessions. She denies any symptoms of depression, however endorse some anxiety about what to do next after discharge. She says she interested in going a long term substance abuse treatment program after discharge. She did not think that going AA meetings will help her much because she knows she will relapse right away after discharge. She says the only times that she has ever been violent against herself was when intoxicated. She rates her anxiety #4 today & depression #0. She denies any alcohol withdrawal symptoms. She denies any SIHI, AVH, delusions or paranoia. Staking & tolerating her treatment regimen. Denies any side effects. Reviewe vital signs. BP remains elevated. Will recheck.  Principal Problem: Major depressive disorder, single episode, severe (HCC)  Diagnosis: Principal Problem:   Major depressive disorder, single episode, severe (HCC) Active Problems:   Alcohol use disorder, severe, dependence (HCC)   Drug overdose   Suicidal ideation   Schizoaffective disorder (HCC)  Total Time spent with patient:  25 minutes  Past Psychiatric History:   Past Medical  History:  Past Medical History:  Diagnosis Date   Anxiety    Depression    GERD (gastroesophageal reflux disease)    HTN (hypertension)    Hypothyroidism    History reviewed. No pertinent surgical history.  Family History: History reviewed. No pertinent family history.  Family Psychiatric  History: See H&P  Social History:  Social History   Substance and Sexual Activity  Alcohol Use Yes   Comment: pint a day     Social History   Substance and Sexual Activity  Drug Use Not Currently    Social History   Socioeconomic History   Marital status: Married    Spouse name: Not on file   Number of children: Not on file   Years of education: Not on file   Highest education level: Not on file  Occupational History   Not on file  Tobacco Use   Smoking status: Never   Smokeless tobacco: Never  Substance and Sexual Activity   Alcohol use: Yes    Comment: pint a day   Drug use: Not Currently   Sexual activity: Yes    Birth control/protection: None  Other Topics Concern   Not on file  Social History Narrative   Not on file   Social Determinants of Health   Financial Resource Strain: Not on file  Food Insecurity: Not on file  Transportation Needs: Not on file  Physical Activity: Not on file  Stress: Not on file  Social Connections: Not on file   Additional Social History:   Sleep: Good  Appetite:  Good  Current Medications: Current Facility-Administered Medications  Medication Dose Route Frequency Provider Last Rate  Last Admin   acetaminophen (TYLENOL) tablet 650 mg  650 mg Oral Q6H PRN Starkes-Perry, Juel Burrow, FNP       alum & mag hydroxide-simeth (MAALOX/MYLANTA) 200-200-20 MG/5ML suspension 30 mL  30 mL Oral Q4H PRN Maryagnes Amos, FNP   30 mL at 04/12/21 1327   amLODipine (NORVASC) tablet 10 mg  10 mg Oral Daily Maryagnes Amos, FNP   10 mg at 04/12/21 0750   atenolol (TENORMIN) tablet 50 mg  50 mg Oral Daily Maryagnes Amos, FNP   50 mg  at 04/12/21 0751   cloNIDine (CATAPRES) tablet 0.1 mg  0.1 mg Oral TID PRN Antonieta Pert, MD       escitalopram (LEXAPRO) tablet 10 mg  10 mg Oral Daily Antonieta Pert, MD   10 mg at 04/12/21 1251   folic acid (FOLVITE) tablet 1 mg  1 mg Oral Daily Maryagnes Amos, FNP   1 mg at 04/12/21 1251   gabapentin (NEURONTIN) capsule 100 mg  100 mg Oral BID Maryagnes Amos, FNP   100 mg at 04/12/21 1639   hydrOXYzine (ATARAX/VISTARIL) tablet 25 mg  25 mg Oral Q6H PRN Maryagnes Amos, FNP   25 mg at 04/11/21 2113   lisinopril (ZESTRIL) tablet 40 mg  40 mg Oral Daily Maryagnes Amos, FNP   40 mg at 04/12/21 0751   LORazepam (ATIVAN) tablet 1 mg  1 mg Oral Q6H PRN Antonieta Pert, MD       magnesium hydroxide (MILK OF MAGNESIA) suspension 30 mL  30 mL Oral Daily PRN Starkes-Perry, Juel Burrow, FNP       multivitamin with minerals tablet 1 tablet  1 tablet Oral Daily Maryagnes Amos, FNP   1 tablet at 04/12/21 0750   pantoprazole (PROTONIX) EC tablet 80 mg  80 mg Oral Q1200 Laveda Abbe, NP   80 mg at 04/12/21 1638   rosuvastatin (CRESTOR) tablet 40 mg  40 mg Oral Daily Maryagnes Amos, FNP   40 mg at 04/12/21 0750   thiamine tablet 100 mg  100 mg Oral Daily Maryagnes Amos, FNP   100 mg at 04/12/21 0750   Or   thiamine (B-1) injection 100 mg  100 mg Intravenous Daily Starkes-Perry, Juel Burrow, FNP       traZODone (DESYREL) tablet 50 mg  50 mg Oral QHS Ajibola, Ene A, NP   50 mg at 04/11/21 2113    Lab Results:  Results for orders placed or performed during the hospital encounter of 04/10/21 (from the past 48 hour(s))  TSH     Status: None   Collection Time: 04/11/21  6:29 PM  Result Value Ref Range   TSH 1.796 0.350 - 4.500 uIU/mL    Comment: Performed by a 3rd Generation assay with a functional sensitivity of <=0.01 uIU/mL. Performed at [facility], [address] [place], [place] [address]   Comprehensive metabolic  panel     Status: Abnormal   Collection Time: 04/12/21  6:24 AM  Result Value Ref Range   Sodium 140 135 - 145 mmol/L   Potassium 4.0 3.5 - 5.1 mmol/L   Chloride 103 98 - 111 mmol/L   CO2 29 22 - 32 mmol/L   Glucose, Bld 92 70 - 99 mg/dL    Comment: Glucose reference range applies only to samples taken after fasting for at least 8 hours.   BUN 19 6 - 20 mg/dL   Creatinine, Ser  0.92 0.44 - 1.00 mg/dL   Calcium 25.3 (H) 8.9 - 10.3 mg/dL   Total Protein 7.1 6.5 - 8.1 g/dL   Albumin 4.0 3.5 - 5.0 g/dL   AST 26 15 - 41 U/L   ALT 27 0 - 44 U/L   Alkaline Phosphatase 45 38 - 126 U/L   Total Bilirubin 0.4 0.3 - 1.2 mg/dL   GFR, Estimated >66 >44 mL/min    Comment: (NOTE) Calculated using the CKD-EPI Creatinine Equation (2021)    Anion gap 8 5 - 15    Comment: Performed at [facility], [address], [place], [place] [address]    Blood Alcohol level:  Lab Results  Component Value Date   ETH 184 (H) 04/07/2021    Metabolic Disorder Labs: No results found for: HGBA1C, MPG No results found for: PROLACTIN No results found for: CHOL, TRIG, HDL, CHOLHDL, VLDL, LDLCALC  Physical Findings: AIMS: Facial and Oral Movements Muscles of Facial Expression: None, normal Lips and Perioral Area: None, normal Jaw: None, normal Tongue: None, normal,Extremity Movements Upper (arms, wrists, hands, fingers): None, normal Lower (legs, knees, ankles, toes): None, normal, Trunk Movements Neck, shoulders, hips: None, normal, Overall Severity Severity of abnormal movements (highest score from questions above): None, normal Incapacitation due to abnormal movements: None, normal Patient's awareness of abnormal movements (rate only patient's report): No Awareness, Dental Status Current problems with teeth and/or dentures?: No Does patient usually wear dentures?: No  CIWA:    COWS:     Musculoskeletal: Strength & Muscle Tone: within normal limits Gait & Station: normal Patient  leans: N/A  Psychiatric Specialty Exam:  Presentation  General Appearance: Fairly Groomed  Eye Contact:Fair  Speech:Normal Rate  Speech Volume:Normal  Handedness:Right   Mood and Affect  Mood:Anxious  Affect:Appropriate   Thought Process  Thought Processes:Coherent  Descriptions of Associations:Intact  Orientation:Full (Time, Place and Person)  Thought Content:Logical  History of Schizophrenia/Schizoaffective disorder:No  Duration of Psychotic Symptoms:No data recorded Hallucinations:Hallucinations: None  Ideas of Reference:None  Suicidal Thoughts:Suicidal Thoughts: No  Homicidal Thoughts:Homicidal Thoughts: No   Sensorium  Memory:Immediate Good; Recent Good; Remote Good  Judgment:Fair  Insight:Fair   Executive Functions  Concentration:Fair  Attention Span:Fair  Recall:Fair  Fund of Knowledge:Fair  Language:Good   Psychomotor Activity  Psychomotor Activity:Psychomotor Activity: Increased   Assets  Assets:Desire for Improvement; Resilience; Social Support; Health and safety inspector; Housing   Sleep  Sleep:Sleep: Good Number of Hours of Sleep: 6    Physical Exam: Physical Exam Vitals and nursing note reviewed.  HENT:     Head: Normocephalic.     Nose: Nose normal.     Mouth/Throat:     Pharynx: Oropharynx is clear.  Eyes:     Pupils: Pupils are equal, round, and reactive to light.  Cardiovascular:     Rate and Rhythm: Normal rate.     Pulses: Normal pulses.  Pulmonary:     Effort: Pulmonary effort is normal.  Genitourinary:    Comments: Deferred Musculoskeletal:        General: Normal range of motion.     Cervical back: Normal range of motion.  Skin:    General: Skin is warm and dry.  Neurological:     General: No focal deficit present.     Mental Status: She is alert and oriented to person, place, and time.   Review of Systems  Constitutional: Negative.   HENT: Negative.    Eyes: Negative.   Respiratory:  Negative.    Cardiovascular: Negative.  Gastrointestinal: Negative.   Genitourinary: Negative.   Musculoskeletal: Negative.   Skin: Negative.   Neurological: Negative.   Endo/Heme/Allergies: Negative.   Psychiatric/Behavioral:  Positive for depression ("Improving") and substance abuse (Hx. alcoholism, chronic). Negative for hallucinations, memory loss and suicidal ideas. The patient is nervous/anxious. The patient does not have insomnia.   Blood pressure (!) 124/95, pulse 62, temperature (!) 97.4 F (36.3 C), temperature source Oral, resp. rate 18, height 5\' 8"  (1.727 m), weight 88 kg, SpO2 97 %. Body mass index is 29.5 kg/m.  Treatment Plan Summary: Daily contact with patient to assess and evaluate symptoms and progress in treatment and Medication management.   Continue inpatient hospitalization.  Will continue today 04/12/2021 plan as below except where it is noted.   Depression. Continue Lexapro 10 mg po daily.  Anxiety. Continue Vistaril 25 mg po Q 6 hrs prn for CIWA>10 Continue Lorazepam 1 mg po Q 6 hrs prn for CIWA >10 Continue gabapentin 100 mg po bid for agitation/alcohol withdrawal syndrome.  Insomnia. Continue Trazodone 50 mg po Q hs.  Other medical issues. Continue amlodipine 10 mg po daily for HTN. Continue atenolol 50 mg po daily for HTN. Continue Clonidine 0.1 mg po tid prn for sbp>150. Continue folic acid 1 mg po daily for folate replacement.  Lisinopril 40 mg po daily HTN Multivitamin po 1 tablet po daily nutritional supplement. Continue Protonix 40 mg po daily for GERD. Continue Crestor 40 mg po daily for hyperlipidemia. Continue Thiamine 100 mg po/IM daily for low thiamine.  Continue other prn medications for other medical symptoms. Encourage group participation. Discharge disposition is ongoing.  04/14/2021, NP, PMHNP, FNP-BC 04/12/2021, 6:19 PM

## 2021-04-12 NOTE — Progress Notes (Signed)
Pt stated she was feeling better. Pt was having issues with her stomach. Pt given PRN Maalox, Trazodone, and Vistaril per [facility].     04/12/21 2200  Psych Admission Type (Psych Patients Only)  Admission Status Voluntary  Psychosocial Assessment  Patient Complaints Anxiety;Worrying  Eye Contact Fair  Facial Expression Animated;Anxious  Affect Anxious;Appropriate to circumstance  Speech Logical/coherent  Interaction Assertive  Motor Activity Slow  Appearance/Hygiene Unremarkable  Behavior Characteristics Cooperative;Anxious  Mood Anxious  Thought Process  Coherency WDL  Content WDL  Delusions None reported or observed  Perception WDL  Hallucination None reported or observed  Judgment Poor  Confusion None  Danger to Self  Current suicidal ideation? Denies  Danger to Others  Danger to Others None reported or observed

## 2021-04-13 MED ORDER — ESCITALOPRAM OXALATE 10 MG PO TABS: 10.0000 mg | ORAL_TABLET | Freq: Every day | ORAL | 0 refills | Status: DC

## 2021-04-13 NOTE — BHH Group Notes (Signed)
BHH Group Notes:  (Nursing/MHT/Case Management/Adjunct)  Date:  04/13/2021  Time:  10:44 AM  Type of Therapy:   Goals group  Participation Level:  Active  Participation Quality:  Appropriate, Attentive, and Sharing  Affect:  Appropriate  Cognitive:  Alert and Appropriate  Insight:  Appropriate and Improving  Engagement in Group:  Engaged  Modes of Intervention:  Discussion and Education  Summary of Progress/Problems:  She reported her goal is "talk to rehab center to get answers and find out when I am leaving."  She slept well with medications.  Victoria Patel 04/13/2021, 10:44 AM

## 2021-04-13 NOTE — Progress Notes (Signed)
Psychoeducational Group Note  Date:  04/13/2021 Time:  2015  Group Topic/Focus:  Wrap up group  Participation Level: Did Not Attend  Participation Quality:  Not Applicable  Affect:  Not Applicable  Cognitive:  Not Applicable  Insight:  Not Applicable  Engagement in Group: Not Applicable  Additional Comments:  Did not attend.   Marcille Buffy 04/13/2021, 10:47 PM

## 2021-04-13 NOTE — BHH Suicide Risk Assessment (Signed)
[facility] Discharge Suicide Risk Assessment   Principal Problem: Major depressive disorder, single episode, severe (HCC) Discharge Diagnoses: Principal Problem:   Major depressive disorder, single episode, severe (HCC) Active Problems:   Drug overdose   Suicidal ideation   Alcohol use disorder, severe, dependence (HCC)   Schizoaffective disorder (HCC)   Total Time spent with patient: 15 minutes  Musculoskeletal: Strength & Muscle Tone: within normal limits Gait & Station: normal Patient leans: N/A  Psychiatric Specialty Exam  Presentation  General Appearance: Appropriate for Environment  Eye Contact:Good  Speech:Normal Rate  Speech Volume:Normal  Handedness:Right   Mood and Affect  Mood:Euthymic  Duration of Depression Symptoms: Greater than two weeks  Affect:Congruent   Thought Process  Thought Processes:Coherent  Descriptions of Associations:Intact  Orientation:Full (Time, Place and Person)  Thought Content:Logical  History of Schizophrenia/Schizoaffective disorder:No  Duration of Psychotic Symptoms:No data recorded Hallucinations:Hallucinations: None  Ideas of Reference:None  Suicidal Thoughts:Suicidal Thoughts: No  Homicidal Thoughts:Homicidal Thoughts: No   Sensorium  Memory:Immediate Good; Recent Good; Remote Good  Judgment:Good  Insight:Good   Executive Functions  Concentration:Good  Attention Span:Good  Recall:Good  Fund of Knowledge:Good  Language:Good   Psychomotor Activity  Psychomotor Activity:Psychomotor Activity: Normal   Assets  Assets:Desire for Improvement; Resilience; Social Support; Health and safety inspector; Talents/Skills; Housing   Sleep  Sleep:Sleep: Good   Physical Exam: Physical Exam Vitals and nursing note reviewed.  Constitutional:      Appearance: Normal appearance.  HENT:     Head: Normocephalic and atraumatic.  Pulmonary:     Effort: Pulmonary effort is normal.  Neurological:      General: No focal deficit present.     Mental Status: She is alert and oriented to person, place, and time.   Review of Systems  All other systems reviewed and are negative. Blood pressure 91/76, pulse 70, temperature (!) 97.3 F (36.3 C), temperature source Oral, resp. rate 18, height 5\' 8"  (1.727 m), weight 88 kg, SpO2 97 %. Body mass index is 29.5 kg/m.  Mental Status Per Nursing Assessment::   On Admission:  Suicidal ideation indicated by patient  Demographic Factors:  Caucasian and Unemployed  Loss Factors: NA  Historical Factors: Prior suicide attempts, Family history of mental illness or substance abuse, and Impulsivity  Risk Reduction Factors:   Sense of responsibility to family and Living with another person, especially a relative  Continued Clinical Symptoms:  Depression:   Comorbid alcohol abuse/dependence Impulsivity Alcohol/Substance Abuse/Dependencies  Cognitive Features That Contribute To Risk:  None    Suicide Risk:  Minimal: No identifiable suicidal ideation.  Patients presenting with no risk factors but with morbid ruminations; may be classified as minimal risk based on the severity of the depressive symptoms   Follow-up Information     [facility]. Go to.   Why: Please go to this provider for outpatient therapy and medication management needs during walk in hours for new patients:  Monday through Friday from 9:00 am to 2:00 pm. Contact information: [address] [place] [phone]         [facility] [phone][facility] Follow up.   Why: A referral has been made to this provider. Contact information: [address] [place] [phone]                 Plan Of Care/Follow-up recommendations:  Activity:  ad lib  [phone], MD 04/13/2021, 3:26 PM

## 2021-04-13 NOTE — BHH Counselor (Signed)
CSW received a phone call from [facility] confirming that patient has been approved for admission.  Patient will need to be admitted at 8am for assessment.  CSW let provider know about discharge and he agreed to request early admission.  CSW called patient husband and he agreed he would be at [facility] for discharge at 7am.    CSW informed patient about discharge plans. Patient agreed to call husband to discuss what she would need packed [facility].    Khamila Bassinger, LCSW, LCAS Clincal Social Worker  [facility]

## 2021-04-13 NOTE — BHH Counselor (Signed)
CSW faxed referral to [facility] and got patient consent to send records.  CSW encouraged patient to call to screen for bed availability.  Patient agreed and was witnessed calling.  [facility] agreed that they would review records within the hour and let us know about admit date within 48 hours. As long as patient is accepted they can be admitted on Friday or Monday.   CSW will call patient support, husband, regarding patient's discharge plans.    Kylyn Mcdade, LCSW, LCAS Clincal Social Worker  [facility]

## 2021-04-13 NOTE — Progress Notes (Signed)
Progress note    04/13/21 0815  Psych Admission Type (Psych Patients Only)  Admission Status Voluntary  Psychosocial Assessment  Patient Complaints Anxiety  Eye Contact Fair  Facial Expression Animated;Anxious  Affect Anxious;Appropriate to circumstance  Speech Logical/coherent  Interaction Assertive  Motor Activity Slow  Appearance/Hygiene Unremarkable  Behavior Characteristics Cooperative;Appropriate to situation;Anxious  Mood Anxious;Pleasant  Thought Process  Coherency WDL  Content Blaming self  Delusions WDL  Perception WDL  Hallucination None reported or observed  Judgment Poor  Confusion None  Danger to Self  Current suicidal ideation? Denies  Danger to Others  Danger to Others None reported or observed

## 2021-04-13 NOTE — Progress Notes (Signed)
[facility] MD Progress Note  04/13/2021 4:18 PM Victoria Patel  MRN:  [id number]  Reason for admission: Victoria Patel is a 54 year old female who was admitted from  [facility] after medically cleared due to intentional ingestion of vodka, approximately 14 Advil tabs. According to ED chart notes, patient declared that she "wanted to go to sleep and not wake up."   Subjective:  Patient states "I am doing great."   Evaluation on the unit today:  Chart was reviewed, patient discussed during treatment team. Patient is alert and oriented, pleasant. She states that her main stressor was her recent move her from [place]. She said she had "great plans" for getting back on track, but "I ended up here instead." She expresses that she is grateful and happy to go to a treatment center and she is looking forward to the future. She denies SI/HI/AVH. Denies paranoia. Reports good sleep and appetite. Behavior has remained appropriate. She engages in group therapy sessions. Patient will be discharged to [facility], 6/17.   Principal Problem: Major depressive disorder, single episode, severe (HCC) Diagnosis: Principal Problem:   Major depressive disorder, single episode, severe (HCC) Active Problems:   Drug overdose   Suicidal ideation   Alcohol use disorder, severe, dependence (HCC)   Schizoaffective disorder (HCC)  Total Time spent with patient: 15 minutes  Past Psychiatric History: Per H&P:" Patient has been admitted for alcohol related reasons several times in the past.  She stated that she had just had a rehabilitation stay in [place], [place] last year.  Alcohol has been a major problem for her."    Past Medical History:  Past Medical History:  Diagnosis Date   Anxiety    Depression    GERD (gastroesophageal reflux disease)    HTN (hypertension)    Hypothyroidism    History reviewed. No pertinent surgical history. Family History: History reviewed. No pertinent family history. Family Psychiatric   History: Per H&P: "Noncontributory" Social History:  Social History   Substance and Sexual Activity  Alcohol Use Yes   Comment: pint a day     Social History   Substance and Sexual Activity  Drug Use Not Currently    Social History   Socioeconomic History   Marital status: Married    Spouse name: Not on file   Number of children: Not on file   Years of education: Not on file   Highest education level: Not on file  Occupational History   Not on file  Tobacco Use   Smoking status: Never   Smokeless tobacco: Never  Substance and Sexual Activity   Alcohol use: Yes    Comment: pint a day   Drug use: Not Currently   Sexual activity: Yes    Birth control/protection: None  Other Topics Concern   Not on file  Social History Narrative   Not on file   Social Determinants of Health   Financial Resource Strain: Not on file  Food Insecurity: Not on file  Transportation Needs: Not on file  Physical Activity: Not on file  Stress: Not on file  Social Connections: Not on file   Additional Social History:    Sleep: Good  Appetite:  Good  Current Medications: Current Facility-Administered Medications  Medication Dose Route Frequency Provider Last Rate Last Admin   acetaminophen (TYLENOL) tablet 650 mg  650 mg Oral Q6H PRN Starkes-Perry, Juel Burrow, FNP       alum & mag hydroxide-simeth (MAALOX/MYLANTA) 200-200-20 MG/5ML suspension 30 mL  30 mL Oral  Q4H PRN Maryagnes Amos, FNP   30 mL at 04/12/21 2103   amLODipine (NORVASC) tablet 10 mg  10 mg Oral Daily Maryagnes Amos, FNP   10 mg at 04/13/21 1610   atenolol (TENORMIN) tablet 50 mg  50 mg Oral Daily Maryagnes Amos, FNP   50 mg at 04/13/21 9604   cloNIDine (CATAPRES) tablet 0.1 mg  0.1 mg Oral TID PRN Antonieta Pert, MD       escitalopram (LEXAPRO) tablet 10 mg  10 mg Oral Daily Antonieta Pert, MD   10 mg at 04/13/21 0815   folic acid (FOLVITE) tablet 1 mg  1 mg Oral Daily Maryagnes Amos,  FNP   1 mg at 04/12/21 1251   gabapentin (NEURONTIN) capsule 100 mg  100 mg Oral BID Maryagnes Amos, FNP   100 mg at 04/13/21 0812   lisinopril (ZESTRIL) tablet 40 mg  40 mg Oral Daily Maryagnes Amos, FNP   40 mg at 04/13/21 5409   LORazepam (ATIVAN) tablet 1 mg  1 mg Oral Q6H PRN Antonieta Pert, MD       magnesium hydroxide (MILK OF MAGNESIA) suspension 30 mL  30 mL Oral Daily PRN Maryagnes Amos, FNP   30 mL at 04/13/21 8119   multivitamin with minerals tablet 1 tablet  1 tablet Oral Daily Maryagnes Amos, FNP   1 tablet at 04/13/21 0812   pantoprazole (PROTONIX) EC tablet 80 mg  80 mg Oral Q1200 Laveda Abbe, NP   80 mg at 04/13/21 1131   rosuvastatin (CRESTOR) tablet 40 mg  40 mg Oral Daily Maryagnes Amos, FNP   40 mg at 04/13/21 1478   thiamine tablet 100 mg  100 mg Oral Daily Maryagnes Amos, FNP   100 mg at 04/13/21 2956   Or   thiamine (B-1) injection 100 mg  100 mg Intravenous Daily Starkes-Perry, Juel Burrow, FNP       traZODone (DESYREL) tablet 50 mg  50 mg Oral QHS Ajibola, Ene A, NP   50 mg at 04/12/21 2101    Lab Results:  Results for orders placed or performed during the hospital encounter of 04/10/21 (from the past 48 hour(s))  TSH     Status: None   Collection Time: 04/11/21  6:29 PM  Result Value Ref Range   TSH 1.796 0.350 - 4.500 uIU/mL    Comment: Performed by a 3rd Generation assay with a functional sensitivity of <=0.01 uIU/mL. Performed at [facility], [address] [place], [place] [address]   Comprehensive metabolic panel     Status: Abnormal   Collection Time: 04/12/21  6:24 AM  Result Value Ref Range   Sodium 140 135 - 145 mmol/L   Potassium 4.0 3.5 - 5.1 mmol/L   Chloride 103 98 - 111 mmol/L   CO2 29 22 - 32 mmol/L   Glucose, Bld 92 70 - 99 mg/dL    Comment: Glucose reference range applies only to samples taken after fasting for at least 8 hours.   BUN 19 6 - 20 mg/dL    Creatinine, Ser 6.57 0.44 - 1.00 mg/dL   Calcium 84.6 (H) 8.9 - 10.3 mg/dL   Total Protein 7.1 6.5 - 8.1 g/dL   Albumin 4.0 3.5 - 5.0 g/dL   AST 26 15 - 41 U/L   ALT 27 0 - 44 U/L   Alkaline Phosphatase 45 38 - 126 U/L   Total Bilirubin  0.4 0.3 - 1.2 mg/dL   GFR, Estimated >30 >09 mL/min    Comment: (NOTE) Calculated using the CKD-EPI Creatinine Equation (2021)    Anion gap 8 5 - 15    Comment: Performed at [facility], [address], [place], [place] [address]    Blood Alcohol level:  Lab Results  Component Value Date   ETH 184 (H) 04/07/2021    Metabolic Disorder Labs: No results found for: HGBA1C, MPG No results found for: PROLACTIN No results found for: CHOL, TRIG, HDL, CHOLHDL, VLDL, LDLCALC  Physical Findings: AIMS: Facial and Oral Movements Muscles of Facial Expression: None, normal Lips and Perioral Area: None, normal Jaw: None, normal Tongue: None, normal,Extremity Movements Upper (arms, wrists, hands, fingers): None, normal Lower (legs, knees, ankles, toes): None, normal, Trunk Movements Neck, shoulders, hips: None, normal, Overall Severity Severity of abnormal movements (highest score from questions above): None, normal Incapacitation due to abnormal movements: None, normal Patient's awareness of abnormal movements (rate only patient's report): No Awareness, Dental Status Current problems with teeth and/or dentures?: No Does patient usually wear dentures?: No  CIWA:    COWS:     Musculoskeletal: Strength & Muscle Tone: within normal limits Gait & Station: normal Patient leans: N/A  Psychiatric Specialty Exam:  Presentation  General Appearance: Appropriate for Environment  Eye Contact:Good  Speech:Normal Rate  Speech Volume:Normal  Handedness:Right   Mood and Affect  Mood:Anxious (related to thinking about going to treatment for alcoholism)  Affect:Appropriate; Congruent   Thought Process  Thought Processes:Coherent;  Goal Directed; Linear  Descriptions of Associations:Intact  Orientation:Full (Time, Place and Person)  Thought Content:WDL; Logical  History of Schizophrenia/Schizoaffective disorder:No  Duration of Psychotic Symptoms:No data recorded Hallucinations:Hallucinations: None (Denies)  Ideas of Reference:None (Denies)  Suicidal Thoughts:Suicidal Thoughts: No (Denies)  Homicidal Thoughts:Homicidal Thoughts: No (Denies)   Sensorium  Memory:Immediate Good; Recent Good  Judgment:Good  Insight:Good   Executive Functions  Concentration:Good  Attention Span:Good  Recall:Good  Fund of Knowledge:Good  Language:Good   Psychomotor Activity  Psychomotor Activity:Psychomotor Activity: Normal   Assets  Assets:Desire for Improvement; Resilience; Social Support; Health and safety inspector; Talents/Skills; Housing   Sleep  Sleep:Sleep: Good    Physical Exam: Physical Exam Vitals and nursing note reviewed.  HENT:     Head: Normocephalic.     Nose: No congestion or rhinorrhea.  Eyes:     General:        Right eye: No discharge.        Left eye: No discharge.  Pulmonary:     Effort: Pulmonary effort is normal.  Musculoskeletal:        General: Normal range of motion.     Cervical back: Normal range of motion.  Neurological:     Mental Status: She is alert and oriented to person, place, and time.   Review of Systems  Psychiatric/Behavioral:  Positive for depression (improving) and substance abuse (Hx of alcohol misuse). Negative for hallucinations, memory loss and suicidal ideas. The patient is nervous/anxious (related to discharge and going to treatment). The patient does not have insomnia.   Blood pressure 91/76, pulse 70, temperature (!) 97.3 F (36.3 C), temperature source Oral, resp. rate 18, height 5\' 8"  (1.727 m), weight 88 kg, SpO2 97 %. Body mass index is 29.5 kg/m.   Treatment Plan Summary: Daily contact with patient to assess and evaluate symptoms and  progress in treatment and Medication management  04/13/21 Update: Patient is stable. No changes. Patient will be discharged to [facility]  04/14/2021.   Continue inpatient hospitalization.  Will continue today 04/13/2021 plan as below except where it is noted.   Depression. Continue Lexapro 10 mg po daily.   Anxiety. Continue Vistaril 25 mg po Q 6 hrs prn for CIWA>10 Continue Lorazepam 1 mg po Q 6 hrs prn for CIWA >10 Continue gabapentin 100 mg po bid for agitation/alcohol withdrawal syndrome.   Insomnia. Continue Trazodone 50 mg po Q hs.   Other medical issues. Continue amlodipine 10 mg po daily for HTN. Continue atenolol 50 mg po daily for HTN. Continue Clonidine 0.1 mg po tid prn for sbp>150. Continue folic acid 1 mg po daily for folate replacement. Lisinopril 40 mg po daily HTN Multivitamin po 1 tablet po daily nutritional supplement. Continue Protonix 40 mg po daily for GERD. Continue Crestor 40 mg po daily for hyperlipidemia. Continue Thiamine 100 mg po/IM daily for low thiamine.   Continue other prn medications for other medical symptoms. Encourage group participation. Discharge disposition: [facility] be [facility]21   Vanetta MuldersLouise F Ibn Stief, NP, PMHNP-BC 04/13/2021, 4:18 PM

## 2021-04-13 NOTE — Progress Notes (Signed)
CSW called [facility] and they confirmed that they received referral and it is currently being reviewed by the medical provider.    Santonio Speakman, LCSW, LCAS Clincal Social Worker  [facility]

## 2021-04-13 NOTE — Progress Notes (Signed)
  [facility] Adult Case Management Discharge Plan :  Will you be returning to the same living situation after discharge:  No. At discharge, do you have transportation home?: Yes,  Patient will be picked up from husband, Adela Lank Dennard Nip) [place], [facility] Do you have the ability to pay for your medications: Yes,  patient has insurance  Release of information consent forms completed and in the chart;  Patient's signature needed at discharge.  Patient to Follow up at:  Follow-up Information     [facility]. Go to.   Why: Please go to this provider for outpatient therapy and medication management needs during walk in hours for new patients:  Monday through Friday from 9:00 am to 2:00 pm. Contact information: [address] [place] [address] [phone]         [facility] Follow up.   Why: A referral has been made to this provider. Contact information: [address] [place] [address] [phone]                 Next level of care provider has access to [facility] Link:no  Safety Planning and Suicide Prevention discussed: Yes,  discussed with patient husband, Dianah Pruett.      Has patient been referred to the Quitline?: Patient refused referral, Patient going to rehab and will not need quit services until after rehab program  Patient has been referred for addiction treatment: Yes, patient will go to [facility] for continued treatment.   Melodie Ashworth E Telsa Dillavou, LCSW 04/13/2021, 3:39 PM

## 2021-04-13 NOTE — Plan of Care (Signed)
  Problem: Health Behavior/Discharge Planning: Goal: Identification of resources available to assist in meeting health care needs will improve Outcome: Progressing Goal: Compliance with treatment plan for underlying cause of condition will improve Outcome: Progressing   Problem: Physical Regulation: Goal: Ability to maintain clinical measurements within normal limits will improve Outcome: Progressing   

## 2021-04-14 NOTE — Progress Notes (Signed)
   04/13/21 2209  Psych Admission Type (Psych Patients Only)  Admission Status Voluntary  Psychosocial Assessment  Patient Complaints None  Eye Contact Fair  Facial Expression Pensive  Affect Appropriate to circumstance  Speech Logical/coherent  Interaction Assertive  Appearance/Hygiene Unremarkable  Behavior Characteristics Cooperative;Appropriate to situation  Mood Pleasant  Thought Process  Coherency WDL  Content Blaming self  Delusions WDL  Perception WDL  Hallucination None reported or observed  Judgment Poor  Confusion None  Danger to Self  Current suicidal ideation? Denies  Danger to Others  Danger to Others None reported or observed

## 2021-04-14 NOTE — Discharge Summary (Signed)
Physician Discharge Summary Note  Patient:  Victoria Patel is an 55 y.o., female MRN:  [id number] DOB:  Oct 25, 1966 Patient phone:  [phone] (home)  Patient address:   [place] [id number],  Total Time spent with patient:  Greater than 30 minutes  Date of Admission:  04/10/2021 Date of Discharge: 04-14-21  Reason for Admission: Worsening alcohol consumption & overdose on Advil tablets.  Principal Problem: Major depressive disorder, single episode, severe (Victoria Patel)  Discharge Diagnoses: Principal Problem:   Major depressive disorder, single episode, severe (Victoria Patel) Active Problems:   Alcohol use disorder, severe, dependence (Victoria Patel)   Drug overdose   Suicidal ideation   Schizoaffective disorder (Victoria Patel)  Past Psychiatric History: Major depressive disorder, Alcohol use disorder.  Past Medical History:  Past Medical History:  Diagnosis Date   Anxiety    Depression    GERD (gastroesophageal reflux disease)    HTN (hypertension)    Hypothyroidism    History reviewed. No pertinent surgical history. Family History: History reviewed. No pertinent family history.  Family Psychiatric  History: See H&P  Social History:  Social History   Substance and Sexual Activity  Alcohol Use Yes   Comment: pint a day     Social History   Substance and Sexual Activity  Drug Use Not Currently    Social History   Socioeconomic History   Marital status: Married    Spouse name: Not on file   Number of children: Not on file   Years of education: Not on file   Highest education level: Not on file  Occupational History   Not on file  Tobacco Use   Smoking status: Never   Smokeless tobacco: Never  Substance and Sexual Activity   Alcohol use: Yes    Comment: pint a day   Drug use: Not Currently   Sexual activity: Yes    Birth control/protection: None  Other Topics Concern   Not on file  Social History Narrative   Not on file   Social Determinants of Health    Financial Resource Strain: Not on file  Food Insecurity: Not on file  Transportation Needs: Not on file  Physical Activity: Not on file  Stress: Not on file  Social Connections: Not on file   Hospital Course: (Per Md's admission evaluation notes): Patient is seen and examined.  Patient is a 55 year old female with a past psychiatric history significant for alcohol dependence who originally presented to the [facility] on 04/07/2021.  The patient and her husband stated that she had drank approximately half a liter of vodka over the last 24 hours.  She also consumed too many bottles and took approximately 14 Advil tablets.  She was sent to the emergency room for evaluation.  She was admitted to the hospital for medical reasons.  She was discharged and transferred to our facility on 04/10/2021.  Part of the reason for being in the medical hospital included a low potassium at 2.8 and a QTC prolongation of 488.  The patient has a longstanding history of alcoholism, and has been treated with Antabuse in the past.  She denied suicidal ideation currently, and does not wish to go to a rehabilitation center after discharge from our facility.   Prior to this discharge, Victoria Patel was seen & evaluated for mental health stability. The current laboratory findings were reviewed (stable), nurses notes & vital signs were reviewed as well. There are no current mental health or medical issues that should prevent this discharge  at this time. Patient is being discharged to continue [facility] as noted below.   After the above admission evaluation, patient's presenting symptoms were noted. She was recommended for alcohol detoxification as well as mood stabilization treatments. The medication regimen targeting those presenting symptoms were discussed with her & initiated with her consent. She received a brief alcohol detoxification treatments using Ativan  regimen/other adjunct medications on a prn basis to manage alcohol withdrawal symptoms. She was also medicated, stabilized & discharged on the medications as listed on her discharge medication lists below. Besides the mood stabilization treatments, Victoria Patel was also enrolled & participated in the group counseling sessions being offered & held on this unit. She learned coping skills. She presented no other significant pre-existing medical issues that required treatment. She tolerated her treatment regimen without any adverse effects or reactions reported.   Victoria Patel's symptoms responded well to her treatment regimen warranting this discharge. Her symptoms has subsided & mood stable. Patient has met the maximum benefit of her hospitalization. She is currently mentally & medically stable to continue further substance abuse treatment, mental health care & medication management at the [facility] as noted below. She is provided with all the necessary information needed to make this appointment without problems.    During the course of her hospitalization, the 15-minute checks were adequate to ensure Victoria Patel's safety.  Patient did not display any dangerous, violent or suicidal behavior on the unit.  She interacted with patients & staff appropriately, participated appropriately in the group sessions/therapies. Her medications were addressed & adjusted to meet her needs. She was recommended for further substance abuse treatment & medication management upon discharge to assure continuity of care.  At the time of discharge patient is not reporting any acute suicidal/homicidal ideations. She feels more confident about her self-care & in managing her substance use issues moving forward. She currently denies any new issues or concerns. Education and supportive counseling provided throughout her hospital stay & upon discharge.  Today upon her discharge evaluation with the attending psychiatrist, Victoria Patel shares she is doing well.  She denies any other specific concerns. She is sleeping well. Her appetite is good. She denies other physical complaints. She denies AH/VH, delusional thoughts or paranoia. She denies any substance withdrawal symptoms. She feels that her medications have been helpful & is in agreement to continue her current treatment regimen as recommended. She was able to engage in safety planning including plan to return to [facility] or contact emergency services if she feels unable to maintain her own safety or the safety of others. Pt had no further questions, comments, or concerns. She left [facility] with all personal belongings in no apparent distress. Transportation per her husband.    Physical Findings: AIMS: Facial and Oral Movements Muscles of Facial Expression: None, normal Lips and Perioral Area: None, normal Jaw: None, normal Tongue: None, normal,Extremity Movements Upper (arms, wrists, hands, fingers): None, normal Lower (legs, knees, ankles, toes): None, normal, Trunk Movements Neck, shoulders, hips: None, normal, Overall Severity Severity of abnormal movements (highest score from questions above): None, normal Incapacitation due to abnormal movements: None, normal Patient's awareness of abnormal movements (rate only patient's report): No Awareness, Dental Status Current problems with teeth and/or dentures?: No Does patient usually wear dentures?: No  CIWA:    COWS:     Musculoskeletal: Strength & Muscle Tone: within normal limits Gait & Station: normal Patient leans: N/A   Psychiatric Specialty Exam:  Presentation  General Appearance: Appropriate for  Environment  Eye Contact:Good  Speech:Normal Rate  Speech Volume:Normal  Handedness:Right   Mood and Affect  Mood:Anxious (related to thinking about going to treatment for alcoholism)  Affect:Appropriate; Congruent   Thought Process  Thought Processes:Coherent; Goal Directed; Linear  Descriptions of  Associations:Intact  Orientation:Full (Time, Place and Person)  Thought Content:WDL; Logical  History of Schizophrenia/Schizoaffective disorder:No  Duration of Psychotic Symptoms:No data recorded Hallucinations:Hallucinations: None (Denies)  Ideas of Reference:None (Denies)  Suicidal Thoughts:Suicidal Thoughts: No (Denies)  Homicidal Thoughts:Homicidal Thoughts: No (Denies)   Sensorium  Memory:Immediate Good; Recent Good  Judgment:Good  Insight:Good   Executive Functions  Concentration:Good  Attention Span:Good  Hoffman of Knowledge:Good  Language:Good   Psychomotor Activity  Psychomotor Activity:Psychomotor Activity: Normal   Assets  Assets:Desire for Improvement; Resilience; Social Support; Catering manager; Talents/Skills; Housing   Sleep  Sleep:Sleep: Good    Physical Exam: Physical Exam Vitals and nursing note reviewed.  HENT:     Head: Normocephalic.     Nose: Nose normal.     Mouth/Throat:     Pharynx: Oropharynx is clear.  Eyes:     Pupils: Pupils are equal, round, and reactive to light.  Cardiovascular:     Rate and Rhythm: Normal rate.     Pulses: Normal pulses.  Pulmonary:     Effort: Pulmonary effort is normal.  Genitourinary:    Comments: Deferred Musculoskeletal:        General: Normal range of motion.     Cervical back: Normal range of motion.  Skin:    General: Skin is warm and dry.  Neurological:     General: No focal deficit present.     Mental Status: She is alert and oriented to person, place, and time. Mental status is at baseline.   Review of Systems  Constitutional: Negative.   HENT: Negative.    Eyes: Negative.   Respiratory: Negative.    Cardiovascular: Negative.   Gastrointestinal: Negative.   Genitourinary: Negative.   Musculoskeletal: Negative.   Skin: Negative.   Neurological: Negative.   Endo/Heme/Allergies:        Allergies: NKDA  Psychiatric/Behavioral:  Positive for  depression (Hx. of (stable on medication)) and substance abuse (Hx. alcoholism, chronic). Negative for hallucinations, memory loss and suicidal ideas. The patient is not nervous/anxious (Stable upon discharge) and does not have insomnia.   Blood pressure 116/87, pulse 72, temperature 97.7 F (36.5 C), temperature source Oral, resp. rate 18, height 5' 8"  (1.727 m), weight 88 kg, SpO2 97 %. Body mass index is 29.5 kg/m.   Social History   Tobacco Use  Smoking Status Never  Smokeless Tobacco Never   Tobacco Cessation:  N/A, patient does not currently use tobacco products  Blood Alcohol level:  Lab Results  Component Value Date   ETH 184 (H) 29/52/8413   Metabolic Disorder Labs:  No results found for: HGBA1C, MPG No results found for: PROLACTIN No results found for: CHOL, TRIG, HDL, CHOLHDL, VLDL, LDLCALC  See Psychiatric Specialty Exam and Suicide Risk Assessment completed by Attending Physician prior to discharge.  Discharge destination:  Other:  [facility]  Is patient on multiple antipsychotic therapies at discharge:  No   Has Patient had three or more failed trials of antipsychotic monotherapy by history:  No  Recommended Plan for Multiple Antipsychotic Therapies: NA  Discharge Instructions     Diet - low sodium heart healthy   Complete by: As directed    Increase activity slowly   Complete by: As directed  Allergies as of 04/14/2021   No Known Allergies      Medication List     STOP taking these medications    chlordiazePOXIDE 25 MG capsule Commonly known as: LIBRIUM   diphenhydramine-acetaminophen 25-500 MG Tabs tablet Commonly known as: TYLENOL PM   FLUoxetine 20 MG capsule Commonly known as: PROZAC   folic acid 1 MG tablet Commonly known as: FOLVITE       TAKE these medications      Indication  amLODipine 10 MG tablet Commonly known as: NORVASC Take 10 mg by mouth daily.  Indication: High Blood Pressure Disorder   atenolol 50 MG  tablet Commonly known as: TENORMIN Take 50 mg by mouth daily.  Indication: High Blood Pressure Disorder   escitalopram 10 MG tablet Commonly known as: LEXAPRO Take 1 tablet (10 mg total) by mouth daily.  Indication: Major Depressive Disorder   gabapentin 100 MG capsule Commonly known as: NEURONTIN Take 1 capsule (100 mg total) by mouth 2 (two) times daily.  Indication: Abuse or Misuse of Alcohol, Alcohol Withdrawal Syndrome   hydrOXYzine 25 MG tablet Commonly known as: ATARAX/VISTARIL Take 1 tablet (25 mg total) by mouth every 6 (six) hours as needed for anxiety (or CIWA score </= 10).  Indication: Feeling Anxious   levothyroxine 75 MCG tablet Commonly known as: SYNTHROID Take 75 mcg by mouth every morning.  Indication: Underactive Thyroid   lisinopril 40 MG tablet Commonly known as: ZESTRIL Take 1 tablet (40 mg total) by mouth daily.  Indication: High Blood Pressure Disorder   multivitamin with minerals Tabs tablet Take 1 tablet by mouth daily.  Indication: nutritional supplementation   NexIUM 24HR 20 MG Tbec Generic drug: Esomeprazole Magnesium Take 20 mg by mouth daily.  Indication: Gastroesophageal Reflux Disease   OVER THE COUNTER MEDICATION Place 1 drop into both eyes daily as needed (dry eyes). Dry eye drop  Indication: dry eyes   rosuvastatin 40 MG tablet Commonly known as: CRESTOR Take 40 mg by mouth daily.  Indication: High Amount of Fats in the Blood   thiamine 100 MG tablet Take 1 tablet (100 mg total) by mouth daily.  Indication: Deficiency of Vitamin B1        Follow-up Information     [facility] [place]. Go to.   Why: Please go to this provider for outpatient therapy and medication management needs during walk in hours for new patients:  Monday through Friday from 9:00 am to 2:00 pm. Contact information: [place] [address] [place] Follow up.   Why: A referral has been  made to this provider. Contact information: [place] [address] [phone]                Follow-up recommendations: Activity:  As tolerated Diet: As recommended by your primary care doctor. Keep all scheduled follow-up appointments as recommended.   Comments: Prescriptions given at discharge.  Patient agreeable to plan.  Given opportunity to ask questions.  Appears to feel comfortable with discharge denies any current suicidal or homicidal thought. Patient is also instructed prior to discharge to: Take all medications as prescribed by his/her mental healthcare provider. Report any adverse effects and or reactions from the medicines to his/her outpatient provider promptly. Patient has been instructed & cautioned: To not engage in alcohol and or illegal drug use while on prescription medicines. In the event of worsening symptoms, patient is instructed to  call the crisis hotline, 911 and or go to the nearest ED for appropriate evaluation and treatment of symptoms. To follow-up with his/her primary care provider for your other medical issues, concerns and or health care needs.   Signed: Lindell Spar, NP 04/14/2021, 12:57 PM

## 2021-04-14 NOTE — Plan of Care (Signed)
Discharge note  Patient verbalizes readiness for discharge. Follow up plan explained, AVS, Transition record and SRA given. Prescriptions and teaching provided. Belongings returned and signed for. Suicide safety plan completed and signed. Patient verbalizes understanding. Patient denies SI/HI and assures this Clinical research associate they will seek assistance should that change. Patient discharged to lobby to wait for husband.  Problem: Education: Goal: Knowledge of [facility] Education information/materials will improve Outcome: Adequate for Discharge Goal: Emotional status will improve Outcome: Adequate for Discharge Goal: Mental status will improve Outcome: Adequate for Discharge Goal: Verbalization of understanding the information provided will improve Outcome: Adequate for Discharge   Problem: Activity: Goal: Interest or engagement in activities will improve Outcome: Adequate for Discharge Goal: Sleeping patterns will improve Outcome: Adequate for Discharge   Problem: Coping: Goal: Ability to verbalize frustrations and anger appropriately will improve Outcome: Adequate for Discharge Goal: Ability to demonstrate self-control will improve Outcome: Adequate for Discharge   Problem: Health Behavior/Discharge Planning: Goal: Identification of resources available to assist in meeting health care needs will improve Outcome: Adequate for Discharge Goal: Compliance with treatment plan for underlying cause of condition will improve Outcome: Adequate for Discharge   Problem: Physical Regulation: Goal: Ability to maintain clinical measurements within normal limits will improve Outcome: Adequate for Discharge   Problem: Safety: Goal: Periods of time without injury will increase Outcome: Adequate for Discharge   Problem: Education: Goal: Utilization of techniques to improve thought processes will improve Outcome: Adequate for Discharge Goal: Knowledge of the prescribed therapeutic regimen  will improve Outcome: Adequate for Discharge   Problem: Activity: Goal: Interest or engagement in leisure activities will improve Outcome: Adequate for Discharge Goal: Imbalance in normal sleep/wake cycle will improve Outcome: Adequate for Discharge   Problem: Coping: Goal: Coping ability will improve Outcome: Adequate for Discharge Goal: Will verbalize feelings Outcome: Adequate for Discharge   Problem: Health Behavior/Discharge Planning: Goal: Ability to make decisions will improve Outcome: Adequate for Discharge Goal: Compliance with therapeutic regimen will improve Outcome: Adequate for Discharge   Problem: Role Relationship: Goal: Will demonstrate positive changes in social behaviors and relationships Outcome: Adequate for Discharge   Problem: Safety: Goal: Ability to disclose and discuss suicidal ideas will improve Outcome: Adequate for Discharge Goal: Ability to identify and utilize support systems that promote safety will improve Outcome: Adequate for Discharge   Problem: Self-Concept: Goal: Will verbalize positive feelings about self Outcome: Adequate for Discharge Goal: Level of anxiety will decrease Outcome: Adequate for Discharge

## 2021-09-15 ENCOUNTER — Ambulatory Visit
Admission: RE | Admit: 2021-09-15 | Discharge: 2021-09-15 | Disposition: A | Discharge status: Home / Self Care | Payer: BC Managed Care – PPO | Source: Ambulatory Visit | Attending: Orthopaedic Surgery | Admitting: Orthopaedic Surgery

## 2021-09-15 ENCOUNTER — Other Ambulatory Visit: Payer: Self-pay | Admitting: Orthopaedic Surgery

## 2021-09-15 DIAGNOSIS — T148XXA Other injury of unspecified body region, initial encounter: Secondary | ICD-10-CM

## 2021-10-03 ENCOUNTER — Emergency Department (HOSPITAL_COMMUNITY)
Admission: EM | Admit: 2021-10-03 | Discharge: 2021-10-03 | Disposition: A | Discharge status: Home / Self Care | Payer: BC Managed Care – PPO | Attending: Emergency Medicine | Admitting: Emergency Medicine

## 2021-10-03 ENCOUNTER — Other Ambulatory Visit: Payer: Self-pay

## 2021-10-03 ENCOUNTER — Encounter (HOSPITAL_COMMUNITY): Payer: Self-pay

## 2021-10-03 DIAGNOSIS — F10920 Alcohol use, unspecified with intoxication, uncomplicated: Secondary | ICD-10-CM

## 2021-10-03 DIAGNOSIS — E039 Hypothyroidism, unspecified: Secondary | ICD-10-CM | POA: Diagnosis not present

## 2021-10-03 DIAGNOSIS — Y908 Blood alcohol level of 240 mg/100 ml or more: Secondary | ICD-10-CM | POA: Diagnosis not present

## 2021-10-03 DIAGNOSIS — F10120 Alcohol abuse with intoxication, uncomplicated: Secondary | ICD-10-CM | POA: Insufficient documentation

## 2021-10-03 DIAGNOSIS — Z79899 Other long term (current) drug therapy: Secondary | ICD-10-CM | POA: Insufficient documentation

## 2021-10-03 DIAGNOSIS — I10 Essential (primary) hypertension: Secondary | ICD-10-CM | POA: Insufficient documentation

## 2021-10-03 LAB — COMPREHENSIVE METABOLIC PANEL
ALT: 20 U/L (ref 0–44)
AST: 26 U/L (ref 15–41)
Albumin: 4.1 g/dL (ref 3.5–5.0)
Alkaline Phosphatase: 53 U/L (ref 38–126)
Anion gap: 12 (ref 5–15)
BUN: 13 mg/dL (ref 6–20)
CO2: 24 mmol/L (ref 22–32)
Calcium: 9.8 mg/dL (ref 8.9–10.3)
Chloride: 107 mmol/L (ref 98–111)
Creatinine, Ser: 0.72 mg/dL (ref 0.44–1.00)
GFR, Estimated: >60 mL/min (ref >60)
Glucose, Bld: 111 mg/dL — ABNORMAL HIGH (ref 70–99)
Potassium: 3.9 mmol/L (ref 3.5–5.1)
Sodium: 143 mmol/L (ref 135–145)
Total Bilirubin: 0.6 mg/dL (ref 0.3–1.2)
Total Protein: 7.2 g/dL (ref 6.5–8.1)

## 2021-10-03 LAB — CBC WITH DIFFERENTIAL/PLATELET
Abs Immature Granulocytes: 0.02 10*3/uL (ref 0.00–0.07)
Basophils Absolute: 0.1 10*3/uL (ref 0.0–0.1)
Basophils Relative: 1 %
Eosinophils Absolute: 0.2 10*3/uL (ref 0.0–0.5)
Eosinophils Relative: 2 %
HCT: 47.8 % — ABNORMAL HIGH (ref 36.0–46.0)
Hemoglobin: 16.2 g/dL — ABNORMAL HIGH (ref 12.0–15.0)
Immature Granulocytes: 0 %
Lymphocytes Relative: 45 %
Lymphs Abs: 3.5 10*3/uL (ref 0.7–4.0)
MCH: 32.2 pg (ref 26.0–34.0)
MCHC: 33.9 g/dL (ref 30.0–36.0)
MCV: 95 fL (ref 80.0–100.0)
Monocytes Absolute: 0.4 10*3/uL (ref 0.1–1.0)
Monocytes Relative: 5 %
Neutro Abs: 3.6 10*3/uL (ref 1.7–7.7)
Neutrophils Relative %: 47 %
Platelets: 297 10*3/uL (ref 150–400)
RBC: 5.03 MIL/uL (ref 3.87–5.11)
RDW: 12.9 % (ref 11.5–15.5)
WBC: 7.7 10*3/uL (ref 4.0–10.5)
nRBC: 0 % (ref 0.0–0.2)

## 2021-10-03 LAB — SALICYLATE LEVEL: Salicylate Lvl: <7 mg/dL — ABNORMAL LOW (ref 7.0–30.0)

## 2021-10-03 LAB — ACETAMINOPHEN LEVEL: Acetaminophen (Tylenol), Serum: <10 ug/mL — ABNORMAL LOW (ref 10–30)

## 2021-10-03 LAB — ETHANOL: Alcohol, Ethyl (B): 396 mg/dL (ref <10)

## 2021-10-03 NOTE — ED Notes (Signed)
Patient Alert and oriented to baseline. Stable and ambulatory to baseline. Patient verbalized understanding of the discharge instructions.  Patient belongings were taken by the patient.   

## 2021-10-03 NOTE — ED Triage Notes (Signed)
Patient has been a recovering alcoholic and the past several months continuing to attend AA meetings and this am a friend found her intoxicated and describes a period of unresponsiveness. On arrival alert and oriented and denies pain. Patient had vodka just prior to arrival.

## 2021-10-03 NOTE — ED Notes (Signed)
Pt refused DC vitals 

## 2021-10-03 NOTE — ED Notes (Signed)
ETOH [address] Pat[address], [place] notified

## 2021-10-03 NOTE — Discharge Instructions (Signed)
Please go to [facility] at 8 am tomorrow.  You may return to the ED at anytime if you are having thoughts of harming yourself or any other concerns, or if you start having severe symptoms of withdrawal such as tremors, chest pain, hallucinations or seizure-like activity.

## 2021-10-03 NOTE — ED Notes (Signed)
Patient states she has a cam boot at home and does not want another one. PA notified.

## 2021-10-03 NOTE — ED Provider Notes (Signed)
Emergency Medicine Provider Triage Evaluation Note  Victoria Patel , a 55 y.o. female  was evaluated in triage.  Pt complains of intoxication.  Patient recently left [facility] and has been drinking daily since.  Patient has a history of alcohol abuse.  Friend at bedside notes that patient became "unresponsive" while in her car.  Friend notes that patient's eyes were open and she continued to breathe however, would not respond.  No convulsions.  Patient responsive in triage however, visibly intoxicated.  Per friend, patient drank vodka prior to arrival.  Patient denies SI, HI, and auditory/visual hallucinations however, her friend notes she is concerned about suicidal ideations.   Level 5 caveat secondary to intoxication  Review of Systems  Positive: Behavior problem Negative: SI  Physical Exam  BP (!) 120/91 (BP Location: Right Arm)   Pulse 79   Temp 98.2 F (36.8 C) (Oral)   Resp 15   SpO2 94%  Gen:   Awake, no distress  visibly intoxicated Resp:  Normal effort  MSK:   Moves extremities without difficulty  Other:    Medical Decision Making  Medically screening exam initiated at 12:42 PM.  Appropriate orders placed.  Victoria Patel was informed that the remainder of the evaluation will be completed by another provider, this initial triage assessment does not replace that evaluation, and the importance of remaining in the ED until their evaluation is complete.  Visibly intoxicated in triage Friend concerned about SI; however patient denies, but also intoxicated. Previous ED visits for [facility] Medical clearance labs ordered, including ethanol level EKG   Victoria Patel, Victoria Patel 10/03/21 1245    Victoria Sleeper, MD 10/03/21 [id number]

## 2021-10-04 LAB — I-STAT BETA HCG BLOOD, ED (MC, WL, AP ONLY): I-stat hCG, quantitative: <5 m[IU]/mL (ref <5)

## 2021-10-05 NOTE — ED Provider Notes (Signed)
[facility] EMERGENCY DEPARTMENT Provider Note   CSN: [id number] Arrival date & time: 10/03/21  1151     History No chief complaint on file.   Victoria Patel is a 55 y.o. female.  Victoria Patel is a 55 y.o. female with a history of alcohol abuse, hypertension, hypothyroidism, GERD, anxiety and depression, who presents to the emergency department for alcohol intoxication.  Per patient and friend at bedside she was recently released from [facility] and since then has relapsed and been drinking again daily.  Patient reports drinking vodka prior to arrival.  Her friend came over to check on her around 815 this morning and at that time she was intoxicated.  A few hours later they decided to drive to go and pick up some lunch and while she was in the car with her friend she had an episode where she was alert with her eyes open but was not responding to her friend.  No convulsions or seizure-like activity noted, but the friend revealing that if she cannot respond she is going to take her to the hospital and she did not say anything which very much concerned her.  Patient does not have memory of this event, but no seizure-like activity was witnessed and when she came around on this episode she was acting like her usual self with no reported postictal period.  She did not have any falls or head injury.  Since then she has been intoxicated but returning to her baseline.  Currently she denies any chest pain, shortness of breath, tremors, hallucinations, headache.  Patient is tearful and she is upset with herself for her current relapse and drinking.  Parent expresses concern over suicidal ideations which patient does have a prior history of.  Friend reports that this morning when she got there she asked the patient what she would have done or she had got there and the patient talked about cutting her wrist.  But the patient reports that now that she is more sober she does not have any suicidal  thoughts.  She becomes tearful stating "I only feel that way when I am drinking and when I am sober I would never think to hurt myself or anyone else".  I obtained additional history from patient's husband reports she has been in rehab multiple times.  He is actually been working on getting her back to treatment and reports that she has a bed at [facility] for tomorrow at 8 AM, but he is very concerned about her coming home because she has a history of hiding alcohol and prescription medications in the house.  The history is provided by the patient, the spouse, a friend and medical records.      Past Medical History:  Diagnosis Date   Anxiety    Depression    GERD (gastroesophageal reflux disease)    HTN (hypertension)    Hypothyroidism     Patient Active Problem List   Diagnosis Date Noted   Schizoaffective disorder (HCC) 04/10/2021   Ingestion of substance    Hypomagnesemia    Hypothyroidism 04/09/2021   QT prolongation 04/09/2021   Drug overdose 04/08/2021   Essential hypertension 04/08/2021   Suicidal ideation 04/08/2021   Hypokalemia 04/08/2021   Alcohol abuse 04/08/2021   Major depressive disorder, single episode, severe (HCC) 04/08/2021   Alcohol use disorder, severe, dependence (HCC) 04/08/2021   Abnormal EKG    Alcoholic intoxication without complication (HCC)     History reviewed. No pertinent surgical history.  OB History   No obstetric history on file.     No family history on file.  Social History   Tobacco Use   Smoking status: Never   Smokeless tobacco: Never  Substance Use Topics   Alcohol use: Yes    Comment: pint a day   Drug use: Not Currently    Home Medications Prior to Admission medications   Medication Sig Start Date End Date Taking? Authorizing Provider  amLODipine (NORVASC) 10 MG tablet Take 10 mg by mouth daily. 03/20/21   [provider]  atenolol (TENORMIN) 50 MG tablet Take 50 mg by mouth daily. 03/28/21   [provider]  escitalopram (LEXAPRO) 10 MG tablet Take 1 tablet (10 mg total) by mouth daily. 04/14/21   Antonieta Pert, MD  Esomeprazole Magnesium (NEXIUM 24HR) 20 MG TBEC Take 20 mg by mouth daily.    [provider]  gabapentin (NEURONTIN) 100 MG capsule Take 1 capsule (100 mg total) by mouth 2 (two) times daily. 04/10/21   Rodolph Bong, MD  hydrOXYzine (ATARAX/VISTARIL) 25 MG tablet Take 1 tablet (25 mg total) by mouth every 6 (six) hours as needed for anxiety (or CIWA score </= 10). 04/10/21   Rodolph Bong, MD  levothyroxine (SYNTHROID) 75 MCG tablet Take 75 mcg by mouth every morning. 03/21/21   [provider]  lisinopril (ZESTRIL) 40 MG tablet Take 1 tablet (40 mg total) by mouth daily. 04/11/21   Rodolph Bong, MD  Multiple Vitamin (MULTIVITAMIN WITH MINERALS) TABS tablet Take 1 tablet by mouth daily. 04/11/21   Rodolph Bong, MD  OVER THE COUNTER MEDICATION Place 1 drop into both eyes daily as needed (dry eyes). Dry eye drop    [provider]  rosuvastatin (CRESTOR) 40 MG tablet Take 40 mg by mouth daily. 03/20/21   [provider]  thiamine 100 MG tablet Take 1 tablet (100 mg total) by mouth daily. 04/11/21   Rodolph Bong, MD    Allergies    Patient has no known allergies.  Review of Systems   Review of Systems  Constitutional:  Negative for chills and fever.  HENT: Negative.    Respiratory:  Negative for cough and shortness of breath.   Cardiovascular:  Negative for chest pain and palpitations.  Gastrointestinal:  Negative for abdominal pain, nausea and vomiting.  Genitourinary:  Negative for dysuria.  Musculoskeletal:  Negative for arthralgias and myalgias.  Skin:  Negative for color change and rash.  Neurological:  Negative for dizziness, tremors, syncope, light-headedness and headaches.  Psychiatric/Behavioral:  Negative for dysphoric mood, hallucinations and suicidal ideas.   All other systems reviewed and are  negative.  Physical Exam Updated Vital Signs BP (!) 120/91 (BP Location: Right Arm)   Pulse 79   Temp 98.2 F (36.8 C) (Oral)   Resp 15   SpO2 94%   Physical Exam Vitals and nursing note reviewed.  Constitutional:      General: She is not in acute distress.    Appearance: Normal appearance. She is well-developed. She is not ill-appearing or diaphoretic.  HENT:     Head: Normocephalic and atraumatic.     Mouth/Throat:     Mouth: Mucous membranes are moist.     Pharynx: Oropharynx is clear.  Eyes:     General:        Right eye: No discharge.        Left eye: No discharge.     Extraocular Movements: Extraocular movements  intact.     Pupils: Pupils are equal, round, and reactive to light.  Cardiovascular:     Rate and Rhythm: Normal rate and regular rhythm.     Pulses: Normal pulses.     Heart sounds: Normal heart sounds.  Pulmonary:     Effort: Pulmonary effort is normal. No respiratory distress.     Breath sounds: Normal breath sounds. No wheezing or rales.     Comments: Respirations equal and unlabored, patient able to speak in full sentences, lungs clear to auscultation bilaterally  Abdominal:     General: Bowel sounds are normal. There is no distension.     Palpations: Abdomen is soft. There is no mass.     Tenderness: There is no abdominal tenderness. There is no guarding.     Comments: Abdomen soft, nondistended, nontender to palpation in all quadrants without guarding or peritoneal signs  Musculoskeletal:        General: No deformity.     Cervical back: Neck supple.  Skin:    General: Skin is warm and dry.     Capillary Refill: Capillary refill takes less than 2 seconds.  Neurological:     Mental Status: She is alert and oriented to person, place, and time.     Coordination: Coordination normal.     Comments: Speech is clear, able to follow commands Moves extremities without ataxia, coordination intact  Psychiatric:        Attention and Perception: Attention  normal. She does not perceive auditory or visual hallucinations.        Mood and Affect: Mood normal. Affect is tearful.        Speech: Speech normal.        Behavior: Behavior normal.        Thought Content: Thought content does not include homicidal or suicidal ideation. Thought content does not include homicidal or suicidal plan.    ED Results / Procedures / Treatments   Labs (all labs ordered are listed, but only abnormal results are displayed) Labs Reviewed  COMPREHENSIVE METABOLIC PANEL - Abnormal; Notable for the following components:      Result Value   Glucose, Bld 111 (*)    All other components within normal limits  ETHANOL - Abnormal; Notable for the following components:   Alcohol, Ethyl (B) 396 (*)    All other components within normal limits  CBC WITH DIFFERENTIAL/PLATELET - Abnormal; Notable for the following components:   Hemoglobin 16.2 (*)    HCT 47.8 (*)    All other components within normal limits  ACETAMINOPHEN LEVEL - Abnormal; Notable for the following components:   Acetaminophen (Tylenol), Serum <10 (*)    All other components within normal limits  SALICYLATE LEVEL - Abnormal; Notable for the following components:   Salicylate Lvl <7.0 (*)    All other components within normal limits  I-STAT BETA HCG BLOOD, ED ([facility], [facility], [facility] ONLY)    EKG EKG Interpretation  Date/Time:  Tuesday October 03 2021 12:57:54 EST Ventricular Rate:  81 PR Interval:  158 QRS Duration: 84 QT Interval:  406 QTC Calculation: 471 R Axis:   38 Text Interpretation: Normal sinus rhythm Normal ECG Confirmed by Kristine Royal [phone]) on 10/03/2021 3:59:08 PM  Radiology No results found.  Procedures Procedures   Medications Ordered in ED Medications - No data to display  ED Course  I have reviewed the triage vital signs and the nursing notes.  Pertinent labs & imaging results that were available during my care  of the patient were reviewed by me and considered in my medical  decision making (see chart for details).    MDM Rules/Calculators/A&P                           55 year old female with history of alcohol abuse presents intoxicated, recently was released from a rehab program and then began drinking heavily again daily, has been drinking throughout the day today.  Was riding in the car with her friend when she had a period where she was reportedly unresponsive, but with eyes open and no witnessed seizure-like activity.  She did not have any described postictal period after this either.  I suspect that this.  Was likely just the peak of her intoxication.  Currently being she was clinically sober able to talk, answer all questions and has been ambulatory in the hallway, he does not remember this episode from earlier today.  Her friend also expressed concern about her safety, this morning when she was much more intoxicated she had made a comment about wanting to slit her wrist.  Patient has prior history of suicidal ideations, but she reports that this is only when she is drinking and when she sobers up she has no desire to hurt herself or others.  Patient's husband has gotten a bed for her at [facility] tomorrow morning but he is concerned about the patient coming home as she has a prior history of hiding alcohol and prescription medications in the house.  I have discussed at length with the husband plans for safety and whether or not it would be appropriate for the patient to return home with he and a friend staying with her tonight and then taking her to function on the morning versus having her observed in the emergency department overnight and then then transporting her to the facility in the morning.  At this time patient is alert, clinically sober and has decision-making capacity and is currently not suicidal and does not meet any criteria for IVC so if she were to stay in the emergency department overnight it would have to be momentarily.  Lab work was ordered  from triage which was significant for an alcohol level of 396, no leukocytosis and no significant electrolyte derangements, normal renal and liver function, negative salicylate and acetaminophen level.  At this time patient appears clinically sober but is not exhibiting any signs or symptoms of withdrawal to suggest she would need a medical admission.  Patient expresses a desire to go home and is verbally able to contract for her safety saying that she wants to go back to [facility] tomorrow for help.  Husband and friend are at bedside and they have come to an agreement to take her home tonight and stay with her, until she goes to [facility] in the morning.  Given that she is not currently suicidal I do not feel that she needs emergent psychiatric evaluation and I feel that this is an appropriate plan.  Patient and spouse are in agreement, patient discharged home in good condition.  Final Clinical Impression(s) / ED Diagnoses Final diagnoses:  Alcoholic intoxication without complication [facility])    Rx / DC Orders ED Discharge Orders     None        Legrand Rams 10/05/21 0030    Wynetta Fines, MD 10/07/21 2252

## 2022-07-10 IMAGING — CT CT ANKLE*R* W/O CM
3 series · 12 of 33 positions shown, 14 images · non-contrast
Comparison: X-ray 09/12/2021

CLINICAL DATA: Twisting ankle injury 3 weeks ago. Lateral malleolar
fracture seen radiographically

EXAM:
CT OF THE RIGHT ANKLE WITHOUT CONTRAST
TECHNIQUE: Multidetector CT imaging of the right ankle was performed according
to the standard protocol. Multiplanar CT image reconstructions were
also generated.

[Series 6: soft tissue lower extremity · axial · 0.31mm/px · z∈[+147,+291]mm · 4 of 106 slices shown, 5 images]
[im 17/106  soft-tissue]
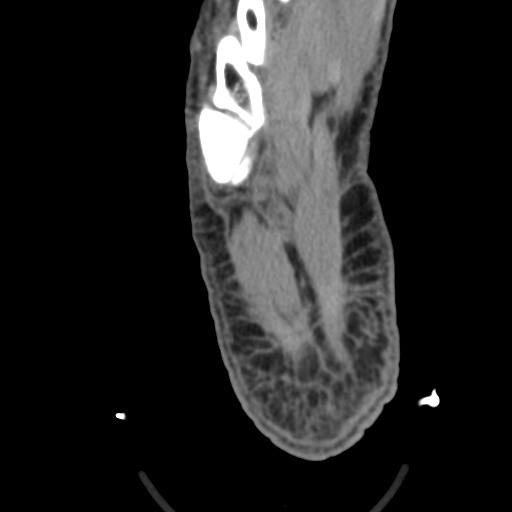
[im 17/106  bone]
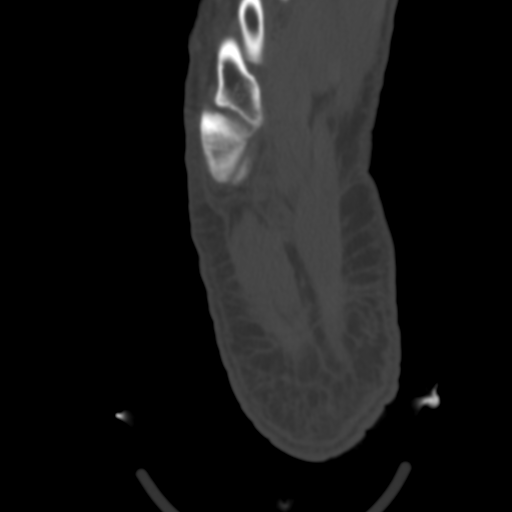
[im 41/106  bone]
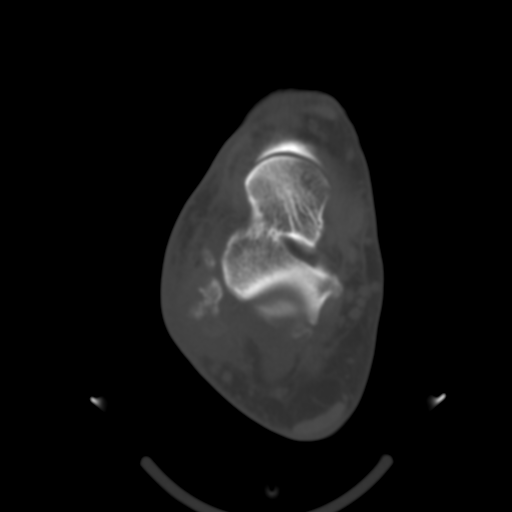
[im 65/106  bone]
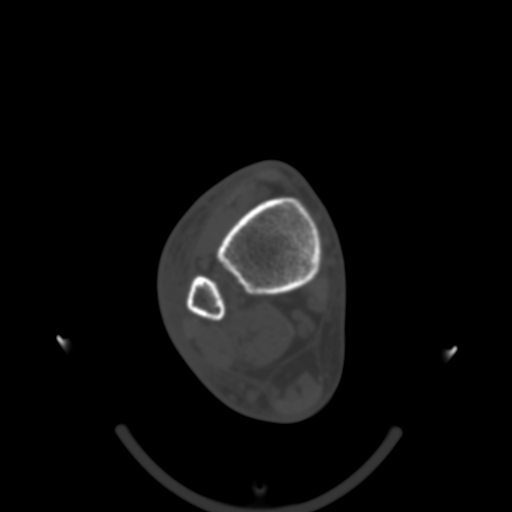
[im 89/106  bone]
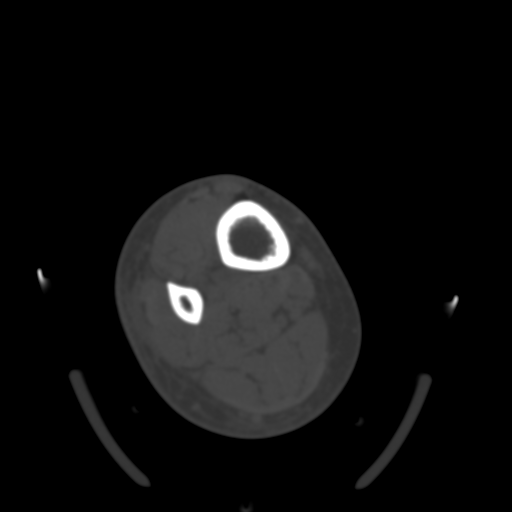

[Series 9: cor soft tissue · coronal · 0.23mm/px · 3 of 86 slices shown]
[im 18/86  bone]
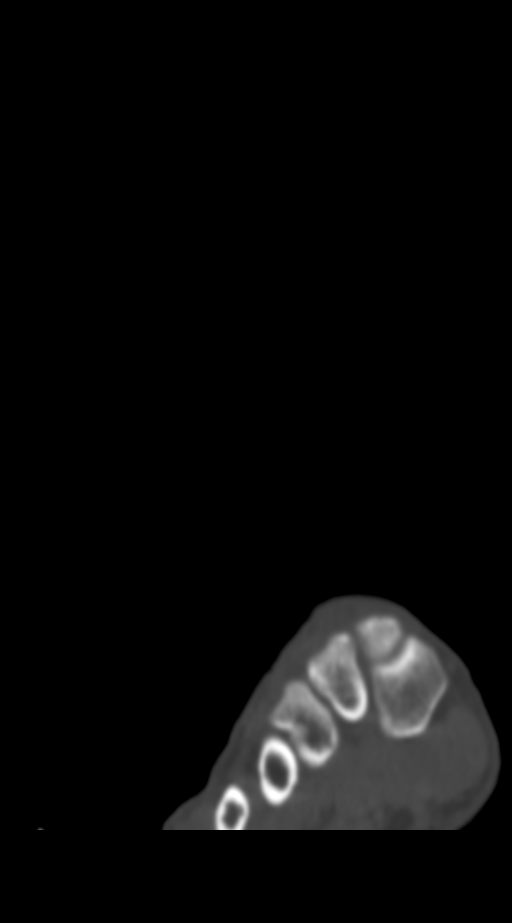
[im 35/86  bone]
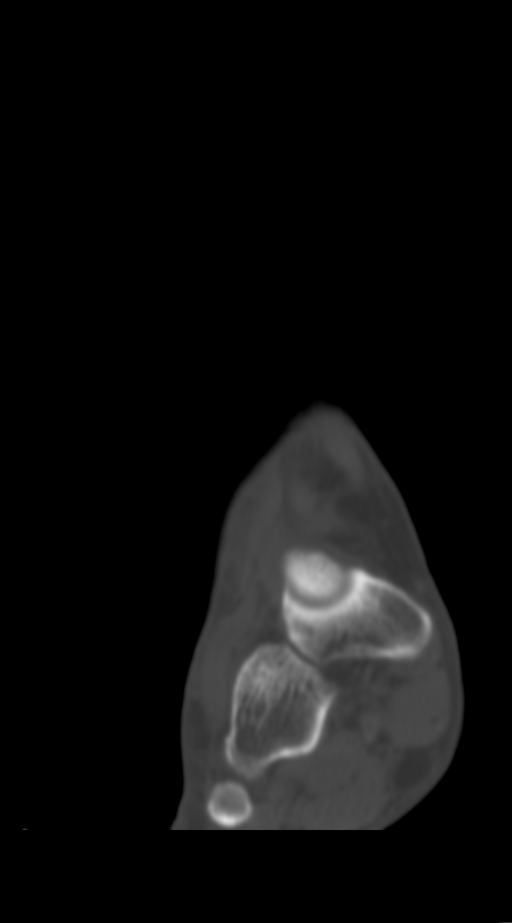
[im 52/86  bone]
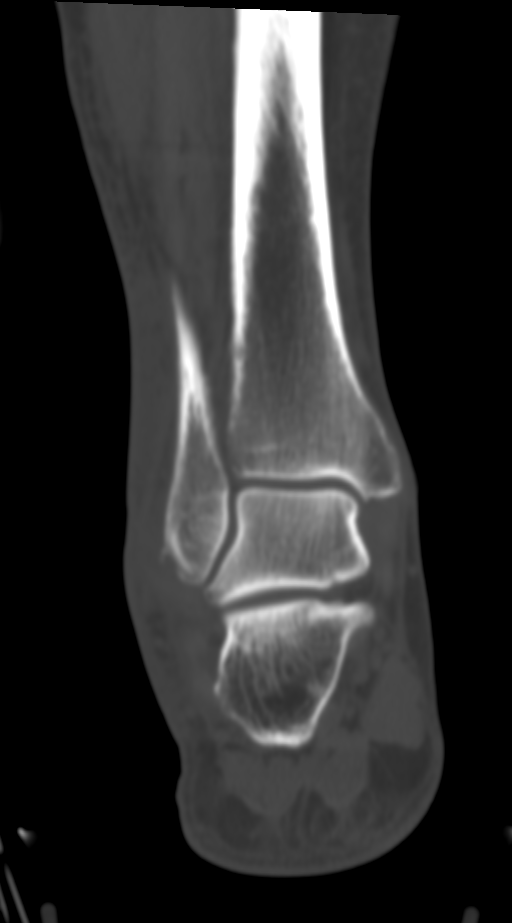

[Series 10: sagsoft tissue · sagittal · 0.32mm/px · 5 of 56 slices shown, 6 images]
[im 19/56  bone]
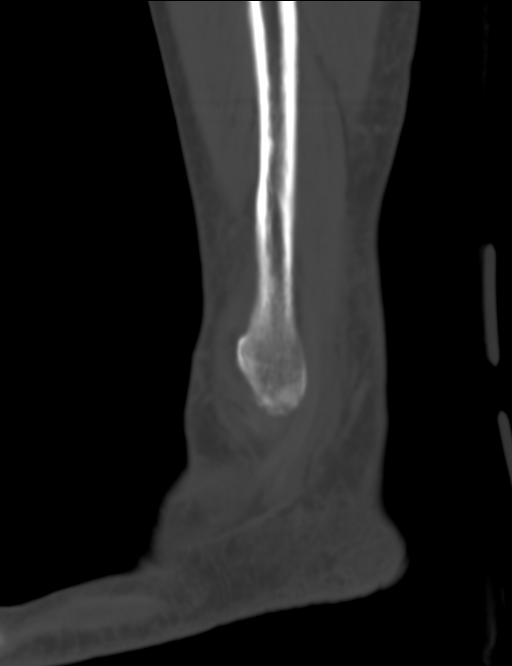
[im 23/56  bone]
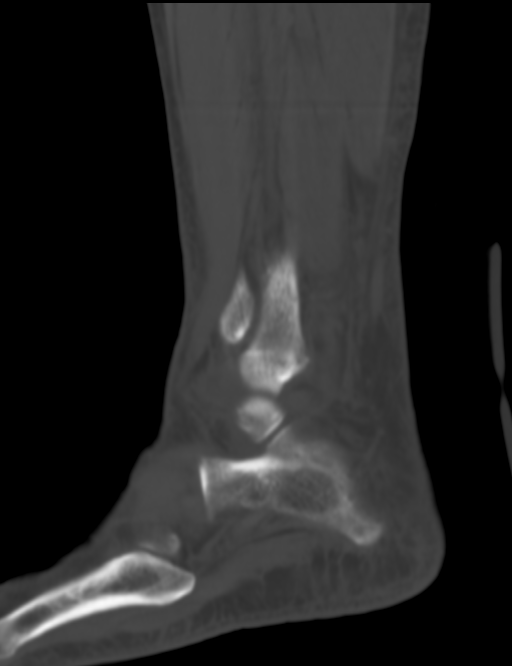
[im 28/56  soft-tissue]
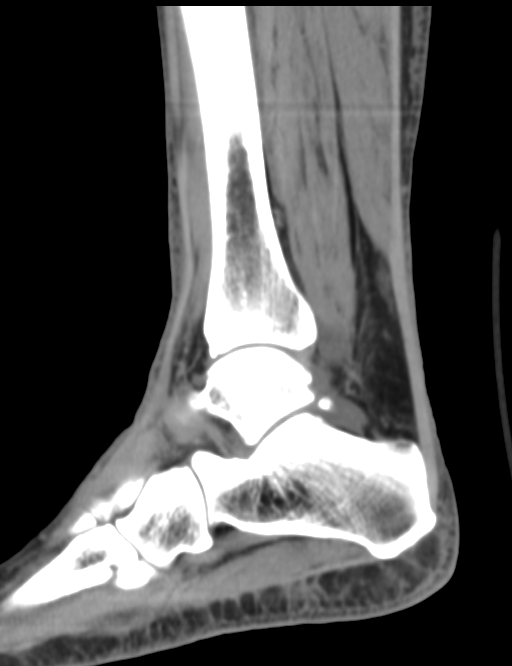
[im 28/56  bone]
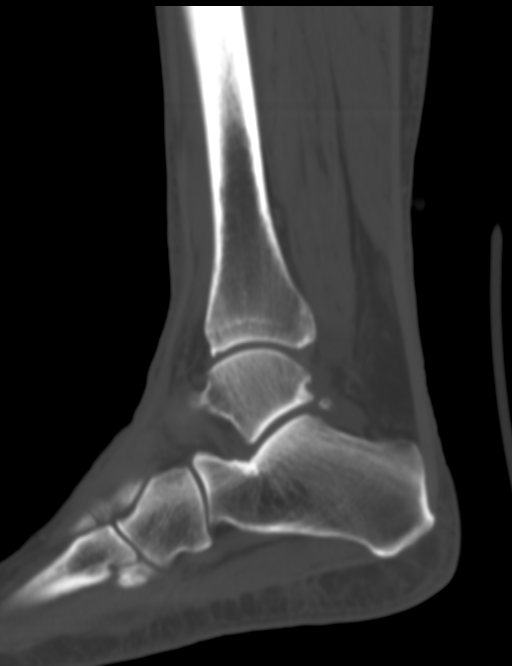
[im 33/56  bone]
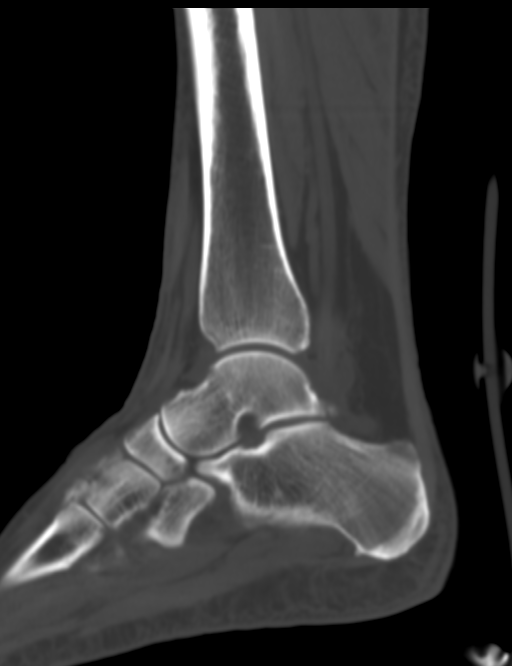
[im 37/56  bone]
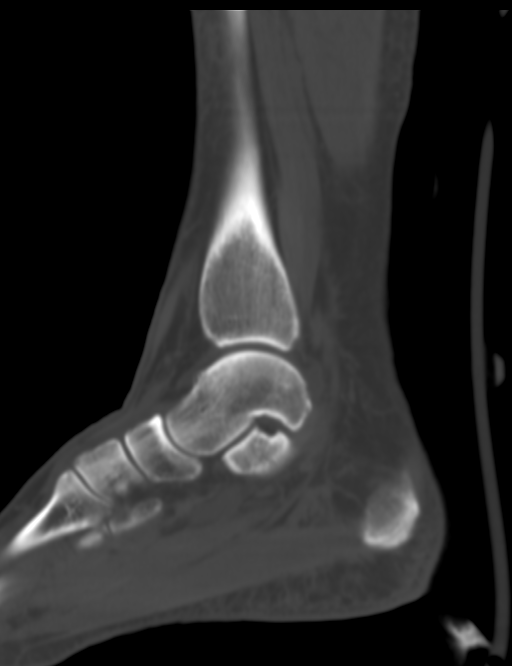

[12 of 33 positions shown; findings below may reference images not displayed]

FINDINGS: Bones/Joint/Cartilage

Acute comminuted, mildly displaced fracture along the inferior tip
of the lateral malleolus (series 7, images 50-53). 14 mm osseous
density along the anteroinferior margin of the lateral malleolus,
which is partially corticated and could reflect sequela of remote
trauma versus a acute displaced fragment. Remaining osseous
structures appear intact. No additional fractures are identified.
Ankle mortise is congruent. Joint spaces are preserved. Os trigonum
is present posteriorly.

Ligaments

Suboptimally assessed by CT.

Muscles and Tendons

No acute musculotendinous abnormality by CT. No evidence of peroneal
tendon dislocation.

Soft tissues

Prominent soft tissue swelling at the lateral ankle adjacent to the
fracture site. No organized hematoma.
IMPRESSION: 1. Acute comminuted, mildly displaced fracture along the inferior
tip of the lateral malleolus.
2. 14 mm osseous density along the anteroinferior margin of the
lateral malleolus, which is partially corticated and could reflect
sequela of remote trauma versus an acute displaced fragment.

## 2024-02-20 ENCOUNTER — Emergency Department (HOSPITAL_BASED_OUTPATIENT_CLINIC_OR_DEPARTMENT_OTHER)

## 2024-02-20 ENCOUNTER — Other Ambulatory Visit: Payer: Self-pay

## 2024-02-20 ENCOUNTER — Encounter (HOSPITAL_BASED_OUTPATIENT_CLINIC_OR_DEPARTMENT_OTHER): Payer: Self-pay | Admitting: Emergency Medicine

## 2024-02-20 ENCOUNTER — Emergency Department (HOSPITAL_BASED_OUTPATIENT_CLINIC_OR_DEPARTMENT_OTHER)
Admission: EM | Admit: 2024-02-20 | Discharge: 2024-02-20 | Disposition: A | Discharge status: Home / Self Care | Attending: Emergency Medicine | Admitting: Emergency Medicine

## 2024-02-20 DIAGNOSIS — Z79899 Other long term (current) drug therapy: Secondary | ICD-10-CM | POA: Diagnosis not present

## 2024-02-20 DIAGNOSIS — N132 Hydronephrosis with renal and ureteral calculous obstruction: Secondary | ICD-10-CM | POA: Insufficient documentation

## 2024-02-20 DIAGNOSIS — I10 Essential (primary) hypertension: Secondary | ICD-10-CM | POA: Diagnosis not present

## 2024-02-20 DIAGNOSIS — R1031 Right lower quadrant pain: Secondary | ICD-10-CM | POA: Diagnosis present

## 2024-02-20 DIAGNOSIS — R197 Diarrhea, unspecified: Secondary | ICD-10-CM | POA: Diagnosis not present

## 2024-02-20 DIAGNOSIS — K625 Hemorrhage of anus and rectum: Secondary | ICD-10-CM | POA: Diagnosis not present

## 2024-02-20 DIAGNOSIS — N200 Calculus of kidney: Secondary | ICD-10-CM

## 2024-02-20 LAB — URINALYSIS, W/ REFLEX TO CULTURE (INFECTION SUSPECTED)
Bacteria, UA: NONE SEEN
Bilirubin Urine: NEGATIVE
Glucose, UA: NEGATIVE mg/dL
Hgb urine dipstick: NEGATIVE
Ketones, ur: NEGATIVE mg/dL
Leukocytes,Ua: NEGATIVE
Nitrite: NEGATIVE
Specific Gravity, Urine: 1.042 — ABNORMAL HIGH (ref 1.005–1.030)
pH: 7.5 (ref 5.0–8.0)

## 2024-02-20 LAB — CBC WITH DIFFERENTIAL/PLATELET
Abs Immature Granulocytes: 0.02 10*3/uL (ref 0.00–0.07)
Basophils Absolute: 0 10*3/uL (ref 0.0–0.1)
Basophils Relative: 1 %
Eosinophils Absolute: 0.1 10*3/uL (ref 0.0–0.5)
Eosinophils Relative: 2 %
HCT: 41.6 % (ref 36.0–46.0)
Hemoglobin: 14 g/dL (ref 12.0–15.0)
Immature Granulocytes: 0 %
Lymphocytes Relative: 25 %
Lymphs Abs: 1.4 10*3/uL (ref 0.7–4.0)
MCH: 31.7 pg (ref 26.0–34.0)
MCHC: 33.7 g/dL (ref 30.0–36.0)
MCV: 94.1 fL (ref 80.0–100.0)
Monocytes Absolute: 0.4 10*3/uL (ref 0.1–1.0)
Monocytes Relative: 8 %
Neutro Abs: 3.6 10*3/uL (ref 1.7–7.7)
Neutrophils Relative %: 64 %
Platelets: 265 10*3/uL (ref 150–400)
RBC: 4.42 MIL/uL (ref 3.87–5.11)
RDW: 12.9 % (ref 11.5–15.5)
WBC: 5.5 10*3/uL (ref 4.0–10.5)
nRBC: 0 % (ref 0.0–0.2)

## 2024-02-20 LAB — COMPREHENSIVE METABOLIC PANEL WITH GFR
ALT: 15 U/L (ref 0–44)
AST: 22 U/L (ref 15–41)
Albumin: 4.4 g/dL (ref 3.5–5.0)
Alkaline Phosphatase: 69 U/L (ref 38–126)
Anion gap: 9 (ref 5–15)
BUN: 10 mg/dL (ref 6–20)
CO2: 25 mmol/L (ref 22–32)
Calcium: 10.4 mg/dL — ABNORMAL HIGH (ref 8.9–10.3)
Chloride: 106 mmol/L (ref 98–111)
Creatinine, Ser: 0.63 mg/dL (ref 0.44–1.00)
GFR, Estimated: >60 mL/min (ref >60)
Glucose, Bld: 99 mg/dL (ref 70–99)
Potassium: 3.9 mmol/L (ref 3.5–5.1)
Sodium: 140 mmol/L (ref 135–145)
Total Bilirubin: 0.5 mg/dL (ref 0.0–1.2)
Total Protein: 6.6 g/dL (ref 6.5–8.1)

## 2024-02-20 LAB — LIPASE, BLOOD: Lipase: 34 U/L (ref 11–51)

## 2024-02-20 LAB — OCCULT BLOOD X 1 CARD TO LAB, STOOL: Fecal Occult Bld: POSITIVE — AB

## 2024-02-20 MED ORDER — TAMSULOSIN HCL 0.4 MG PO CAPS: 0.4000 mg | ORAL_CAPSULE | Freq: Every day | ORAL | 0 refills | Status: DC

## 2024-02-20 MED ORDER — OXYCODONE-ACETAMINOPHEN 5-325 MG PO TABS: 1.0000 | ORAL_TABLET | Freq: Four times a day (QID) | ORAL | 0 refills | Status: DC | PRN

## 2024-02-20 MED ORDER — AMOXICILLIN-POT CLAVULANATE 875-125 MG PO TABS
1.0000 | ORAL_TABLET | Freq: Two times a day (BID) | ORAL | 0 refills | Status: DC
Start: 2024-02-20 — End: 2024-09-05

## 2024-02-20 MED ORDER — ONDANSETRON HCL 4 MG PO TABS: 4.0000 mg | ORAL_TABLET | Freq: Four times a day (QID) | ORAL | 0 refills | Status: DC

## 2024-02-20 MED ORDER — MORPHINE SULFATE (PF) 4 MG/ML IV SOLN
4.0000 mg | Freq: Once | INTRAVENOUS | Status: AC
  Administered 2024-02-20: 4 mg via INTRAVENOUS
  Filled 2024-02-20: qty 1

## 2024-02-20 MED ORDER — IOHEXOL 300 MG/ML  SOLN
100.0000 mL | Freq: Once | INTRAMUSCULAR | Status: AC | PRN
  Administered 2024-02-20: 100 mL via INTRAVENOUS

## 2024-02-20 MED ORDER — SODIUM CHLORIDE 0.9 % IV BOLUS
1000.0000 mL | Freq: Once | INTRAVENOUS | Status: AC
  Administered 2024-02-20: 1000 mL via INTRAVENOUS

## 2024-02-20 MED ORDER — ONDANSETRON HCL 4 MG/2ML IJ SOLN
4.0000 mg | Freq: Once | INTRAMUSCULAR | Status: AC
  Administered 2024-02-20: 4 mg via INTRAVENOUS
  Filled 2024-02-20: qty 2

## 2024-02-20 NOTE — ED Notes (Signed)
 Patient transported to CT

## 2024-02-20 NOTE — ED Notes (Signed)
 Patient informed me when walking to the room, she has had a syncope episode 4/23.

## 2024-02-20 NOTE — ED Notes (Signed)
 Discharge instructions, follow up care, and prescriptions reviewed and explained, pt verbalized understanding and had no further questions on d/c. Urine strainer provided. Pt caox4, ambulatory, NAD on d/c.

## 2024-02-20 NOTE — ED Provider Notes (Signed)
 [place] EMERGENCY DEPARTMENT AT [facility] Provider Note   CSN: [id number] Arrival date & time: 02/20/24  4098     History  Chief Complaint  Patient presents with   Abdominal Pain    Victoria Patel is a 58 y.o. female.  Victoria Patel is a 58 y.o. female with a history of hypertension, GERD, depression and anxiety, who presents to the emergency department for evaluation of abdominal pain, diarrhea and rectal bleeding.  Patient reports that she began experiencing pain across her lower abdomen yesterday, unsure if it is worse on one side than the other.  She reports that it seems to come in waves where it becomes more severe but never completely goes away.  She reports since yesterday she has had numerous episodes of diarrhea.  This morning she noticed some bright red blood in her stool.  She reports some nausea but no vomiting.  Fevers or chills.  She does report that yesterday during a moment when pain felt severe she began to feel lightheaded and sat down and then had a brief loss of consciousness but has not had any additional syncopal episodes.  She is not on any anticoagulation.  Reports that she has had some occasional rectal bleeding that has always been self resolved and so she has not had evaluated but none more recently.  Does not have a local GI doctor.  The history is provided by the patient and the spouse.  Abdominal Pain Associated symptoms: diarrhea and nausea   Associated symptoms: no chest pain, no chills, no cough, no dysuria, no fever, no hematuria, no shortness of breath and no vomiting        Home Medications Prior to Admission medications   Medication Sig Start Date End Date Taking? Authorizing Provider  amLODipine  (NORVASC ) 10 MG tablet Take 10 mg by mouth daily. 03/20/21   [provider]  atenolol  (TENORMIN ) 50 MG tablet Take 50 mg by mouth daily. 03/28/21   [provider]  escitalopram  (LEXAPRO ) 10 MG tablet Take 1 tablet (10 mg total)  by mouth daily. 04/14/21   Ananias Balls, MD  Esomeprazole Magnesium  (NEXIUM 24HR) 20 MG TBEC Take 20 mg by mouth daily.    [provider]  gabapentin  (NEURONTIN ) 100 MG capsule Take 1 capsule (100 mg total) by mouth 2 (two) times daily. 04/10/21   Armenta Landau, MD  hydrOXYzine  (ATARAX /VISTARIL ) 25 MG tablet Take 1 tablet (25 mg total) by mouth every 6 (six) hours as needed for anxiety (or CIWA score </= 10). 04/10/21   Armenta Landau, MD  levothyroxine  (SYNTHROID ) 75 MCG tablet Take 75 mcg by mouth every morning. 03/21/21   [provider]  lisinopril  (ZESTRIL ) 40 MG tablet Take 1 tablet (40 mg total) by mouth daily. 04/11/21   Armenta Landau, MD  Multiple Vitamin (MULTIVITAMIN WITH MINERALS) TABS tablet Take 1 tablet by mouth daily. 04/11/21   Armenta Landau, MD  OVER THE COUNTER MEDICATION Place 1 drop into both eyes daily as needed (dry eyes). Dry eye drop    [provider]  rosuvastatin  (CRESTOR ) 40 MG tablet Take 40 mg by mouth daily. 03/20/21   [provider]  thiamine  100 MG tablet Take 1 tablet (100 mg total) by mouth daily. 04/11/21   Armenta Landau, MD      Allergies    Patient has no known allergies.    Review of Systems   Review of Systems  Constitutional:  Negative for chills and  fever.  HENT: Negative.    Respiratory:  Negative for cough and shortness of breath.   Cardiovascular:  Negative for chest pain.  Gastrointestinal:  Positive for abdominal pain, blood in stool, diarrhea and nausea. Negative for vomiting.  Genitourinary:  Negative for dysuria, frequency and hematuria.  Musculoskeletal:  Negative for arthralgias and myalgias.  Skin:  Negative for color change and rash.  All other systems reviewed and are negative.   Physical Exam Updated Vital Signs BP (!) 202/127 (BP Location: Right Arm)   Pulse 62   Temp 98.2 F (36.8 C)   Resp 17   SpO2 100%  Physical Exam Vitals and nursing note reviewed.   Constitutional:      General: She is not in acute distress.    Appearance: Normal appearance. She is well-developed. She is not ill-appearing or diaphoretic.  HENT:     Head: Normocephalic and atraumatic.  Eyes:     General:        Right eye: No discharge.        Left eye: No discharge.     Pupils: Pupils are equal, round, and reactive to light.  Cardiovascular:     Rate and Rhythm: Normal rate and regular rhythm.     Pulses: Normal pulses.     Heart sounds: Normal heart sounds.  Pulmonary:     Effort: Pulmonary effort is normal. No respiratory distress.     Breath sounds: Normal breath sounds. No wheezing or rales.     Comments: Respirations equal and unlabored, patient able to speak in full sentences, lungs clear to auscultation bilaterally  Abdominal:     General: Bowel sounds are normal. There is no distension.     Palpations: Abdomen is soft. There is no mass.     Tenderness: There is abdominal tenderness in the right lower quadrant, suprapubic area and left lower quadrant. There is no guarding.     Comments: Abdomen soft, nondistended, bowel sounds present throughout, there is diffuse lower abdominal tenderness to light and deep palpation without guarding or rebound tenderness, no upper abdominal tenderness, negative CVA tenderness bilaterally  Musculoskeletal:        General: No deformity.     Cervical back: Neck supple.  Skin:    General: Skin is warm and dry.     Capillary Refill: Capillary refill takes less than 2 seconds.  Neurological:     Mental Status: She is alert and oriented to person, place, and time.     Coordination: Coordination normal.     Comments: Speech is clear, able to follow commands CN III-XII intact Normal strength in upper and lower extremities bilaterally including dorsiflexion and plantar flexion, strong and equal grip strength Sensation normal to light and sharp touch Moves extremities without ataxia, coordination intact  Psychiatric:         Mood and Affect: Mood normal.        Behavior: Behavior normal.    ED Results / Procedures / Treatments   Labs (all labs ordered are listed, but only abnormal results are displayed) Labs Reviewed  COMPREHENSIVE METABOLIC PANEL WITH GFR - Abnormal; Notable for the following components:      Result Value   Calcium  10.4 (*)    All other components within normal limits  URINALYSIS, W/ REFLEX TO CULTURE (INFECTION SUSPECTED) - Abnormal; Notable for the following components:   Specific Gravity, Urine 1.042 (*)    Protein, ur TRACE (*)    All other components within normal limits  OCCULT BLOOD X 1 CARD TO LAB, STOOL - Abnormal; Notable for the following components:   Fecal Occult Bld POSITIVE (*)    All other components within normal limits  LIPASE, BLOOD  CBC WITH DIFFERENTIAL/PLATELET    EKG None  Radiology CT ABDOMEN PELVIS W CONTRAST Result Date: 02/20/2024 CLINICAL DATA:  LLQ abdominal pain EXAM: CT ABDOMEN AND PELVIS WITH CONTRAST TECHNIQUE: Multidetector CT imaging of the abdomen and pelvis was performed using the standard protocol following bolus administration of intravenous contrast. RADIATION DOSE REDUCTION: This exam was performed according to the departmental dose-optimization program which includes automated exposure control, adjustment of the mA and/or kV according to patient size and/or use of iterative reconstruction technique. CONTRAST:  OMNIPAQUE  IOHEXOL  300 MG/ML  SOLN COMPARISON:  None Available. FINDINGS: Lower chest: No focal airspace consolidation or pleural effusion. Hepatobiliary: A few small cysts noted in the right hepatic lobe measuring up to 2 cm. No mass. No radiopaque stones or wall thickening of the gallbladder. No intrahepatic or extrahepatic biliary ductal dilation. The portal veins are patent. Pancreas: No mass or main ductal dilation. No peripancreatic inflammation or fluid collection. Spleen: Normal size. No mass. Adrenals/Urinary Tract: No adrenal  masses. No renal mass. Mild right-sided hydronephrosis. There is an obstructive calculus at the right UPJ, measuring 4 x 3 x 6 mm. The urinary bladder is obscured by metallic streak artifact from hip arthroplasties. The partially visualized urinary bladder is distended without focal abnormality. Stomach/Bowel: Decompressed stomach with sleeve gastrectomy changes. Small hiatal hernia. No small bowel wall thickening or inflammation. No small bowel obstruction. Normal appendix. Total colonic diverticulosis. No changes of acute diverticulitis. Vascular/Lymphatic: No aortic aneurysm. Scattered aortoiliac atherosclerosis. No intraabdominal or pelvic lymphadenopathy. Reproductive: Partially obscured by metallic streak artifact from both hip arthroplasties. Enlarged uterus containing multiple uterine fibroids. No concerning adnexal mass. No free pelvic fluid. Other: No pneumoperitoneum, ascites, or mesenteric inflammation. Musculoskeletal: No acute fracture or destructive lesion. L5-S1 degenerative disc disease. 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 IMPRESSION: 1. Obstructive calculus at the right UPJ, measuring 4 x 3 x 6 mm, causing mild right-sided hydronephrosis. 2. Total colonic diverticulosis. No changes of acute diverticulitis. 3. Enlarged uterus containing multiple fibroids. Electronically Signed   By: Rance Burrows M.D.   On: 02/20/2024 12:35     Procedures Procedures    Medications Ordered in ED Medications - No data to display  ED Course/ Medical Decision Making/ A&P                                 Medical Decision Making Amount and/or Complexity of Data Reviewed Labs: ordered. Radiology: ordered.  Risk Prescription drug management.   Patient presents to the ED with complaints of abdominal pain and rectal bleeding. Patient nontoxic appearing, in no apparent distress, vitals WNL . On exam patient tender to palpation across the lower abdomin, worse in LLQ, no  peritoneal signs. Will evaluate with labs and CT.   Ddx including but not limited to: Colitis, diverticulitis, gastroenteritis, peptic ulcer disease, nephrolithiasis, ovarian cyst, ovarian torsion, appendicitis  Additional history obtained:  Additional history obtained from chart review & nursing note review.   Lab Tests:  I Ordered, viewed, and interpreted labs, which included:  CBC: No leukocytosis, despite reported rectal bleeding hemoglobin is normal CMP: No significant electrolyte derangements, normal renal and liver function Lipase: WNL UA: Trace protein noted, no evidence of infection Hemoccult positive  Imaging  Studies ordered:  I ordered imaging studies which included CT abdomen pelvis with contrast, I independently reviewed, formal radiology impression shows:  Right renal stone at the right UPJ with mild hydronephrosis, diffuse colonic diverticulosis noted without evidence of diverticulitis, enlarged uterus containing multiple fibroids  ED Course:  I ordered IV fluids, morphine  and Zofran   RE-EVAL: Symptoms improved, tolerating p.o.  On repeat abdominal exam patient remains without peritoneal signs, low suspicion for cholecystitis, pancreatitis, diverticulitis, appendicitis, bowel obstruction/perforation,  PID, ectopic pregnancy, or other acute surgical process. Patient tolerating PO in the emergency department. Will discharge home with supportive measures. I discussed results, treatment plan, need for PCP follow-up, and return precautions with the patient. Provided opportunity for questions, patient confirmed understanding and is in agreement with plan.   Portions of this note were generated with Scientist, clinical (histocompatibility and immunogenetics). Dictation errors may occur despite best attempts at proofreading.           Final Clinical Impression(s) / ED Diagnoses Final diagnoses:  Nephrolithiasis  Diarrhea, unspecified type  Rectal bleeding    Rx / DC Orders ED Discharge Orders           Ordered    amoxicillin -clavulanate (AUGMENTIN ) 875-125 MG tablet  Every 12 hours        02/20/24 1319    oxyCODONE -acetaminophen  (PERCOCET/ROXICET) 5-325 MG tablet  Every 6 hours PRN        02/20/24 1319    ondansetron  (ZOFRAN ) 4 MG tablet  Every 6 hours        02/20/24 1319    tamsulosin  (FLOMAX ) 0.4 MG CAPS capsule  Daily        02/20/24 1319              Everlyn Hockey, PA-C 02/26/24 2146    Zackowski, Scott, MD 02/28/24 [phone]

## 2024-02-20 NOTE — Discharge Instructions (Addendum)
 Your CT scan shows a 6 mm kidney stone on the right side just below the kidney.  In most cases stones of this size will pass on their own with the help with medications.  To treat pain use prescribed oxycodone , this is a narcotic pain medication, please use caution when taking this medication and do not drive.  This can be used every 6 hours as needed for pain.  Take prescribed Zofran  as needed for any nausea or vomiting.  Use prescribed Flomax  daily to help ease passing of stone.  You can use provided urine strainer to help know when the kidney stone passes.  CT without clear cause for diarrhea and rectal bleeding.  Bleeding could be coming from internal hemorrhoid versus diverticula or inflammation from diarrhea.  Given severity of diarrhea please take prescribed antibiotics twice daily with food and probiotic.  If diarrhea and bleeding is not improving please follow-up with primary care and GI.  If you develop worsening bleeding or recurrent syncopal episodes or any other new or worsening symptoms return for reevaluation.

## 2024-02-20 NOTE — ED Triage Notes (Signed)
 Pt c/o lower abd pain with n/d starting yesterday and rectal bleeding today

## 2024-09-04 ENCOUNTER — Emergency Department (HOSPITAL_COMMUNITY)
Admission: EM | Admit: 2024-09-04 | Discharge: 2024-09-04 | Disposition: A | Discharge status: Home / Self Care | Attending: Emergency Medicine | Admitting: Emergency Medicine

## 2024-09-04 DIAGNOSIS — I10 Essential (primary) hypertension: Secondary | ICD-10-CM | POA: Insufficient documentation

## 2024-09-04 DIAGNOSIS — F10932 Alcohol use, unspecified with withdrawal with perceptual disturbance: Secondary | ICD-10-CM | POA: Insufficient documentation

## 2024-09-04 DIAGNOSIS — R110 Nausea: Secondary | ICD-10-CM | POA: Diagnosis present

## 2024-09-04 DIAGNOSIS — E039 Hypothyroidism, unspecified: Secondary | ICD-10-CM | POA: Insufficient documentation

## 2024-09-04 DIAGNOSIS — Z79899 Other long term (current) drug therapy: Secondary | ICD-10-CM | POA: Insufficient documentation

## 2024-09-04 LAB — COMPREHENSIVE METABOLIC PANEL WITH GFR
ALT: 17 U/L (ref 0–44)
AST: 23 U/L (ref 15–41)
Albumin: 3.8 g/dL (ref 3.5–5.0)
Alkaline Phosphatase: 65 U/L (ref 38–126)
Anion gap: 6 (ref 5–15)
BUN: 14 mg/dL (ref 6–20)
CO2: 28 mmol/L (ref 22–32)
Calcium: 10.2 mg/dL (ref 8.9–10.3)
Chloride: 110 mmol/L (ref 98–111)
Creatinine, Ser: 0.84 mg/dL (ref 0.44–1.00)
GFR, Estimated: >60 mL/min (ref >60)
Glucose, Bld: 107 mg/dL — ABNORMAL HIGH (ref 70–99)
Potassium: 4 mmol/L (ref 3.5–5.1)
Sodium: 144 mmol/L (ref 135–145)
Total Bilirubin: 0.3 mg/dL (ref 0.0–1.2)
Total Protein: 6 g/dL — ABNORMAL LOW (ref 6.5–8.1)

## 2024-09-04 LAB — URINE DRUG SCREEN
Amphetamines: NEGATIVE
Barbiturates: NEGATIVE
Benzodiazepines: POSITIVE — AB
Cocaine: NEGATIVE
Fentanyl: NEGATIVE
Methadone Scn, Ur: NEGATIVE
Opiates: NEGATIVE
Tetrahydrocannabinol: POSITIVE — AB

## 2024-09-04 LAB — CBC
HCT: 39.3 % (ref 36.0–46.0)
Hemoglobin: 12.3 g/dL (ref 12.0–15.0)
MCH: 31.5 pg (ref 26.0–34.0)
MCHC: 31.3 g/dL (ref 30.0–36.0)
MCV: 100.8 fL — ABNORMAL HIGH (ref 80.0–100.0)
Platelets: 195 K/uL (ref 150–400)
RBC: 3.9 MIL/uL (ref 3.87–5.11)
RDW: 12.3 % (ref 11.5–15.5)
WBC: 5.1 K/uL (ref 4.0–10.5)
nRBC: 0 % (ref 0.0–0.2)

## 2024-09-04 LAB — ETHANOL: Alcohol, Ethyl (B): <15 mg/dL (ref <15)

## 2024-09-04 MED ORDER — LORAZEPAM 2 MG/ML IJ SOLN: 1.0000 mg | INTRAMUSCULAR | Status: DC | PRN

## 2024-09-04 MED ORDER — ADULT MULTIVITAMIN W/MINERALS CH
1.0000 | ORAL_TABLET | Freq: Every day | ORAL | Status: DC
  Administered 2024-09-04: 1 via ORAL
  Filled 2024-09-04: qty 1

## 2024-09-04 MED ORDER — ONDANSETRON 8 MG PO TBDP: 8.0000 mg | ORAL_TABLET | Freq: Three times a day (TID) | ORAL | 0 refills | Status: AC | PRN

## 2024-09-04 MED ORDER — THIAMINE MONONITRATE 100 MG PO TABS: 100.0000 mg | ORAL_TABLET | Freq: Every day | ORAL | Status: DC

## 2024-09-04 MED ORDER — THIAMINE HCL 100 MG/ML IJ SOLN
100.0000 mg | Freq: Every day | INTRAMUSCULAR | Status: DC
  Administered 2024-09-04: 100 mg via INTRAVENOUS
  Filled 2024-09-04: qty 2

## 2024-09-04 MED ORDER — LORAZEPAM 1 MG PO TABS: 1.0000 mg | ORAL_TABLET | ORAL | Status: DC | PRN

## 2024-09-04 MED ORDER — FOLIC ACID 1 MG PO TABS
1.0000 mg | ORAL_TABLET | Freq: Every day | ORAL | Status: DC
  Administered 2024-09-04: 1 mg via ORAL
  Filled 2024-09-04: qty 1

## 2024-09-04 MED ORDER — LORAZEPAM 2 MG/ML IJ SOLN
2.0000 mg | Freq: Once | INTRAMUSCULAR | Status: AC
  Administered 2024-09-04: 2 mg via INTRAVENOUS
  Filled 2024-09-04: qty 1

## 2024-09-04 MED ORDER — CHLORDIAZEPOXIDE HCL 25 MG PO CAPS: ORAL_CAPSULE | ORAL | 0 refills | Status: DC

## 2024-09-04 NOTE — ED Triage Notes (Addendum)
 [facility]IB[facility] for symptoms of alcohol withdrawal.  Per EMS pt started acting disoriented and fatigued around 1pm.  3pm pt woke from a nap and was having hallucinations. EMS stated pt was grabbing at the air.     Last drink was Monday 08/31/24.  Librium  given 20 mg @ 8am today. 25mg  each day prior (t/w/th)

## 2024-09-04 NOTE — ED Notes (Signed)
 Spoke with Rea Loan at [facility] to give report on patient and to let her know that patient is cleared to return. Per Rea someone will be coming to pick patient up to transfer [facility] back [facility]. Husband at bedside. IV discontinued.

## 2024-09-04 NOTE — ED Notes (Signed)
 Pt dressed out and cords removed from the room

## 2024-09-04 NOTE — Discharge Instructions (Addendum)
 Take the Librium  to help with any withdrawal symptoms.  The Zofran  is to help with nausea.  Return to the ED for worsening symptoms including hallucinations, vomiting, shaking.

## 2024-09-04 NOTE — ED Provider Notes (Signed)
 [place] EMERGENCY DEPARTMENT AT [facility] Provider Note   CSN: [id number] Arrival date & time: 09/04/24  1609     Patient presents with: Alcohol Problem   Victoria Patel is a 58 y.o. female.    Alcohol Problem     Patient has a history of depression hypertension and anxiety acid reflux hypothyroidism.  The patient presents to the ED with complaints of alcohol withdrawal.  [facility] her last drink was a few days ago, she thinks Tuesday.  Patient states she started having more difficulty this afternoon.  She woke up from a nap and thought she saw butterflies in the room.  Patient was told that there were no butterflies.  Patient states she has been having some nausea.  She has not had any history of seizures.  Prior to Admission medications   Medication Sig Start Date End Date Taking? Authorizing Provider  chlordiazePOXIDE  (LIBRIUM ) 25 MG capsule 50mg  PO TID x 1D, then 25-50mg  PO BID X 1D, then 25-50mg  PO QD X 1D 09/04/24  Yes Randol Simmonds, MD  ondansetron  (ZOFRAN -ODT) 8 MG disintegrating tablet Take 1 tablet (8 mg total) by mouth every 8 (eight) hours as needed for nausea or vomiting. 09/04/24  Yes Randol Simmonds, MD  amLODipine  (NORVASC ) 10 MG tablet Take 10 mg by mouth daily. 03/20/21   [provider]  amoxicillin -clavulanate (AUGMENTIN ) 875-125 MG tablet Take 1 tablet by mouth every 12 (twelve) hours. 02/20/24   Alva Larraine FALCON, PA-C  atenolol  (TENORMIN ) 50 MG tablet Take 50 mg by mouth daily. 03/28/21   [provider]  escitalopram  (LEXAPRO ) 10 MG tablet Take 1 tablet (10 mg total) by mouth daily. 04/14/21   Kendall Cathlyn Collum, MD  Esomeprazole Magnesium  (NEXIUM 24HR) 20 MG TBEC Take 20 mg by mouth daily.    [provider]  gabapentin  (NEURONTIN ) 100 MG capsule Take 1 capsule (100 mg total) by mouth 2 (two) times daily. 04/10/21   Sebastian Toribio GAILS, MD  hydrOXYzine  (ATARAX /VISTARIL ) 25 MG tablet Take 1 tablet  (25 mg total) by mouth every 6 (six) hours as needed for anxiety (or CIWA score </= 10). 04/10/21   Sebastian Toribio GAILS, MD  levothyroxine  (SYNTHROID ) 75 MCG tablet Take 75 mcg by mouth every morning. 03/21/21   [provider]  lisinopril  (ZESTRIL ) 40 MG tablet Take 1 tablet (40 mg total) by mouth daily. 04/11/21   Sebastian Toribio GAILS, MD  Multiple Vitamin (MULTIVITAMIN WITH MINERALS) TABS tablet Take 1 tablet by mouth daily. 04/11/21   Sebastian Toribio GAILS, MD  ondansetron  (ZOFRAN ) 4 MG tablet Take 1 tablet (4 mg total) by mouth every 6 (six) hours. 02/20/24   Kehrli, Kelsey F, PA-C  OVER THE COUNTER MEDICATION Place 1 drop into both eyes daily as needed (dry eyes). Dry eye drop    [provider]  oxyCODONE -acetaminophen  (PERCOCET/ROXICET) 5-325 MG tablet Take 1 tablet by mouth every 6 (six) hours as needed for severe pain (pain score 7-10). 02/20/24   Alva Larraine FALCON, PA-C  rosuvastatin  (CRESTOR ) 40 MG tablet Take 40 mg by mouth daily. 03/20/21   [provider]  tamsulosin  (FLOMAX ) 0.4 MG CAPS capsule Take 1 capsule (0.4 mg total) by mouth daily. 02/20/24   Kehrli, Kelsey F, PA-C  thiamine  100 MG tablet Take 1 tablet (100 mg total) by mouth daily. 04/11/21   Sebastian Toribio GAILS, MD    Allergies: Patient has no known allergies.    Review of Systems  Updated Vital Signs BP (!) 129/90 (BP Location: Right Arm)   Pulse 72   Temp 98 F (36.7 C) (Oral)   Resp 17   SpO2 98%   Physical Exam Vitals and nursing note reviewed.  Constitutional:      General: She is not in acute distress.    Appearance: She is well-developed.  HENT:     Head: Normocephalic and atraumatic.     Right Ear: External ear normal.     Left Ear: External ear normal.  Eyes:     General: No scleral icterus.       Right eye: No discharge.        Left eye: No discharge.     Conjunctiva/sclera: Conjunctivae normal.  Neck:     Trachea: No tracheal deviation.  Cardiovascular:     Rate and Rhythm:  Normal rate and regular rhythm.  Pulmonary:     Effort: Pulmonary effort is normal. No respiratory distress.     Breath sounds: Normal breath sounds. No stridor. No wheezing or rales.  Abdominal:     General: Bowel sounds are normal. There is no distension.     Palpations: Abdomen is soft.     Tenderness: There is no abdominal tenderness. There is no guarding or rebound.  Musculoskeletal:        General: No tenderness or deformity.     Cervical back: Neck supple.  Skin:    General: Skin is warm and dry.     Findings: No rash.  Neurological:     General: No focal deficit present.     Mental Status: She is alert.     Cranial Nerves: No cranial nerve deficit, dysarthria or facial asymmetry.     Sensory: No sensory deficit.     Motor: No abnormal muscle tone or seizure activity.     Coordination: Coordination normal.     Comments: No Tremor noted  Psychiatric:        Mood and Affect: Affect is tearful.        Speech: Speech normal.        Behavior: Behavior is not aggressive or hyperactive.     (all labs ordered are listed, but only abnormal results are displayed) Labs Reviewed  COMPREHENSIVE METABOLIC PANEL WITH GFR - Abnormal; Notable for the following components:      Result Value   Glucose, Bld 107 (*)    Total Protein 6.0 (*)    All other components within normal limits  CBC - Abnormal; Notable for the following components:   MCV 100.8 (*)    All other components within normal limits  URINE DRUG SCREEN - Abnormal; Notable for the following components:   Benzodiazepines POSITIVE (*)    Tetrahydrocannabinol POSITIVE (*)    All other components within normal limits  ETHANOL    EKG: None  Radiology: No results found.   Procedures   Medications Ordered in the ED  LORazepam  (ATIVAN ) injection 1-4 mg (has no administration in time range)  thiamine  (VITAMIN B1) tablet 100 mg ( Oral See Alternative 09/04/24 1719)    Or  thiamine  (VITAMIN B1) injection 100 mg (100 mg  Intravenous Given 09/04/24 1719)  folic acid  (FOLVITE ) tablet 1 mg (1 mg Oral Given 09/04/24 1720)  multivitamin with minerals tablet 1 tablet (1 tablet Oral Given 09/04/24 1719)  LORazepam  (ATIVAN ) injection 2 mg (2 mg Intravenous Given 09/04/24 1719)    Clinical Course as of 09/04/24 2028  Fri Sep 04, 2024  1753 Rapid  urine drug screen (hospital performed)(!) UDS positive for THC and benzodiazepines.  CBC normal.  Metabolic panel unremarkable alcohol level negative [JK]  1853 Patient states she is doing well.  No tremor noted.  No tachycardia or hypertension.  Will continue to monitor a little longer here in the ED [JK]    Clinical Course User Index [JK] Randol Simmonds, MD                                 Medical Decision Making Problems Addressed: Alcohol withdrawal syndrome with perceptual disturbance [facility]): acute illness or injury that poses a threat to life or bodily functions  Amount and/or Complexity of Data Reviewed Labs: ordered. Decision-making details documented in ED Course.  Risk OTC drugs. Prescription drug management.   Patient presented to the ED with complaints of hallucinations in the setting of alcohol cessation.  Patient was getting treatment at [facility].  Today she started seeing butterflies.  Patient symptoms resolved by the time she was in the ED.  She was not hallucinating.  She is not having any nausea or vomiting.  No significant tremors noted.  Blood pressure and heart rate reassuring.  Patient was monitored in the ER for several hours.  No signs of delirium tremens or severe alcohol withdrawal at this time.  Patient appears stable for continued outpatient management.  Will prescribe Librium      Final diagnoses:  Alcohol withdrawal syndrome with perceptual disturbance [facility])    ED Discharge Orders          Ordered    chlordiazePOXIDE  (LIBRIUM ) 25 MG capsule        09/04/24 2027    ondansetron  (ZOFRAN -ODT) 8 MG disintegrating tablet  Every 8 hours  PRN        09/04/24 2027               Randol Simmonds, MD 09/04/24 2030

## 2024-09-04 NOTE — ED Notes (Signed)
 Husband at bedside.

## 2024-09-05 ENCOUNTER — Other Ambulatory Visit: Payer: Self-pay

## 2024-09-05 ENCOUNTER — Emergency Department (HOSPITAL_COMMUNITY)
Admission: EM | Admit: 2024-09-05 | Discharge: 2024-09-06 | Disposition: A | Attending: Emergency Medicine | Admitting: Emergency Medicine

## 2024-09-05 ENCOUNTER — Encounter (HOSPITAL_COMMUNITY): Payer: Self-pay

## 2024-09-05 DIAGNOSIS — E039 Hypothyroidism, unspecified: Secondary | ICD-10-CM | POA: Diagnosis not present

## 2024-09-05 DIAGNOSIS — F32A Depression, unspecified: Secondary | ICD-10-CM | POA: Insufficient documentation

## 2024-09-05 DIAGNOSIS — F332 Major depressive disorder, recurrent severe without psychotic features: Secondary | ICD-10-CM

## 2024-09-05 DIAGNOSIS — I10 Essential (primary) hypertension: Secondary | ICD-10-CM | POA: Diagnosis not present

## 2024-09-05 DIAGNOSIS — F1094 Alcohol use, unspecified with alcohol-induced mood disorder: Secondary | ICD-10-CM | POA: Diagnosis present

## 2024-09-05 DIAGNOSIS — R45851 Suicidal ideations: Secondary | ICD-10-CM | POA: Insufficient documentation

## 2024-09-05 DIAGNOSIS — Z79899 Other long term (current) drug therapy: Secondary | ICD-10-CM | POA: Diagnosis not present

## 2024-09-05 DIAGNOSIS — R519 Headache, unspecified: Secondary | ICD-10-CM | POA: Diagnosis present

## 2024-09-05 DIAGNOSIS — F1014 Alcohol abuse with alcohol-induced mood disorder: Secondary | ICD-10-CM | POA: Diagnosis not present

## 2024-09-05 DIAGNOSIS — K5900 Constipation, unspecified: Secondary | ICD-10-CM | POA: Diagnosis present

## 2024-09-05 DIAGNOSIS — F1020 Alcohol dependence, uncomplicated: Secondary | ICD-10-CM | POA: Insufficient documentation

## 2024-09-05 DIAGNOSIS — F411 Generalized anxiety disorder: Secondary | ICD-10-CM | POA: Diagnosis not present

## 2024-09-05 LAB — CBC WITH DIFFERENTIAL/PLATELET
Abs Immature Granulocytes: 0.01 K/uL (ref 0.00–0.07)
Basophils Absolute: 0 K/uL (ref 0.0–0.1)
Basophils Relative: 1 %
Eosinophils Absolute: 0.2 K/uL (ref 0.0–0.5)
Eosinophils Relative: 3 %
HCT: 37.3 % (ref 36.0–46.0)
Hemoglobin: 12.3 g/dL (ref 12.0–15.0)
Immature Granulocytes: 0 %
Lymphocytes Relative: 32 %
Lymphs Abs: 1.8 K/uL (ref 0.7–4.0)
MCH: 33.1 pg (ref 26.0–34.0)
MCHC: 33 g/dL (ref 30.0–36.0)
MCV: 100.3 fL — ABNORMAL HIGH (ref 80.0–100.0)
Monocytes Absolute: 0.6 K/uL (ref 0.1–1.0)
Monocytes Relative: 10 %
Neutro Abs: 3.1 K/uL (ref 1.7–7.7)
Neutrophils Relative %: 54 %
Platelets: 175 K/uL (ref 150–400)
RBC: 3.72 MIL/uL — ABNORMAL LOW (ref 3.87–5.11)
RDW: 12.1 % (ref 11.5–15.5)
WBC: 5.7 K/uL (ref 4.0–10.5)
nRBC: 0 % (ref 0.0–0.2)

## 2024-09-05 LAB — COMPREHENSIVE METABOLIC PANEL WITH GFR
ALT: 15 U/L (ref 0–44)
AST: 17 U/L (ref 15–41)
Albumin: 3.1 g/dL — ABNORMAL LOW (ref 3.5–5.0)
Alkaline Phosphatase: 41 U/L (ref 38–126)
Anion gap: 8 (ref 5–15)
BUN: 8 mg/dL (ref 6–20)
CO2: 28 mmol/L (ref 22–32)
Calcium: 9.7 mg/dL (ref 8.9–10.3)
Chloride: 102 mmol/L (ref 98–111)
Creatinine, Ser: 0.76 mg/dL (ref 0.44–1.00)
GFR, Estimated: >60 mL/min (ref >60)
Glucose, Bld: 95 mg/dL (ref 70–99)
Potassium: 3.6 mmol/L (ref 3.5–5.1)
Sodium: 138 mmol/L (ref 135–145)
Total Bilirubin: 0.8 mg/dL (ref 0.0–1.2)
Total Protein: 5.5 g/dL — ABNORMAL LOW (ref 6.5–8.1)

## 2024-09-05 LAB — ETHANOL: Alcohol, Ethyl (B): <15 mg/dL (ref <15)

## 2024-09-05 LAB — ACETAMINOPHEN LEVEL: Acetaminophen (Tylenol), Serum: <10 ug/mL — ABNORMAL LOW (ref 10–30)

## 2024-09-05 LAB — SALICYLATE LEVEL: Salicylate Lvl: <7 mg/dL — ABNORMAL LOW (ref 7.0–30.0)

## 2024-09-05 MED ORDER — THIAMINE MONONITRATE 100 MG PO TABS
100.0000 mg | ORAL_TABLET | Freq: Every day | ORAL | Status: DC
  Administered 2024-09-06: 100 mg via ORAL
  Filled 2024-09-05: qty 1

## 2024-09-05 MED ORDER — LORAZEPAM 1 MG PO TABS
0.0000 mg | ORAL_TABLET | Freq: Four times a day (QID) | ORAL | Status: DC
  Administered 2024-09-06: 1 mg via ORAL
  Filled 2024-09-05: qty 1

## 2024-09-05 MED ORDER — LORAZEPAM 1 MG PO TABS: 0.0000 mg | ORAL_TABLET | Freq: Two times a day (BID) | ORAL | Status: DC

## 2024-09-05 MED ORDER — THIAMINE HCL 100 MG/ML IJ SOLN: 100.0000 mg | Freq: Every day | INTRAMUSCULAR | Status: DC

## 2024-09-05 MED ORDER — AMLODIPINE BESYLATE 5 MG PO TABS
5.0000 mg | ORAL_TABLET | Freq: Every day | ORAL | Status: DC
  Administered 2024-09-06: 5 mg via ORAL
  Filled 2024-09-05: qty 1

## 2024-09-05 MED ORDER — ESCITALOPRAM OXALATE 10 MG PO TABS
20.0000 mg | ORAL_TABLET | Freq: Every morning | ORAL | Status: DC
  Administered 2024-09-06: 20 mg via ORAL
  Filled 2024-09-05: qty 2

## 2024-09-05 MED ORDER — FOLIC ACID 1 MG PO TABS
1.0000 mg | ORAL_TABLET | Freq: Every day | ORAL | Status: DC
  Administered 2024-09-06: 1 mg via ORAL
  Filled 2024-09-05: qty 1

## 2024-09-05 MED ORDER — LORAZEPAM 2 MG/ML IJ SOLN: 1.0000 mg | INTRAMUSCULAR | Status: DC | PRN

## 2024-09-05 MED ORDER — PANTOPRAZOLE SODIUM 40 MG PO TBEC
40.0000 mg | DELAYED_RELEASE_TABLET | Freq: Every day | ORAL | Status: DC
  Administered 2024-09-06: 40 mg via ORAL
  Filled 2024-09-05: qty 1

## 2024-09-05 MED ORDER — ATENOLOL 25 MG PO TABS
50.0000 mg | ORAL_TABLET | Freq: Every day | ORAL | Status: DC
  Administered 2024-09-06: 50 mg via ORAL
  Filled 2024-09-05: qty 2

## 2024-09-05 MED ORDER — BENAZEPRIL HCL 20 MG PO TABS
40.0000 mg | ORAL_TABLET | Freq: Every day | ORAL | Status: DC
  Administered 2024-09-06: 40 mg via ORAL
  Filled 2024-09-05: qty 2

## 2024-09-05 MED ORDER — LORAZEPAM 1 MG PO TABS: 1.0000 mg | ORAL_TABLET | ORAL | Status: DC | PRN

## 2024-09-05 MED ORDER — ADULT MULTIVITAMIN W/MINERALS CH
1.0000 | ORAL_TABLET | Freq: Every day | ORAL | Status: DC
  Administered 2024-09-06: 1 via ORAL
  Filled 2024-09-05: qty 1

## 2024-09-05 NOTE — ED Triage Notes (Signed)
 Patient BIB [facility] from [facility] by provider on site for [facility] after admitting that she attempted suicide while off site yesterday. Patient admits this plan to RN today as well. Patient states that she wants to detox and work on her depressive thoughts and coping mechanisms to get better.

## 2024-09-05 NOTE — ED Notes (Signed)
 IVC paperwork complete and in blue zone, expires 09/12/24 @ 6:15pm, case # [id number]

## 2024-09-05 NOTE — [facility] Counselor (Signed)
 Clinician requested [facility] consul to complete TTS assessment.  Patient is being roomed number MRN number age date of birth was provided.  [facility] coordinator to update secure chat when assessment time and provider are available

## 2024-09-05 NOTE — ED Provider Notes (Signed)
 [facility] AT [facility] Provider Note   CSN: [id number] Arrival date & time: 09/05/24  1949     Patient presents with: No chief complaint on file.   Zeola Brys is a 58 y.o. female history of hypertension, alcohol abuse, depression who presenting with hallucinations and suicidal ideation.  [facility].  Patient's last drink was 4 days ago.  Patient came here yesterday because she has some hallucinations and has been seeing butterflies.  Patient denies any auditory hallucination.  Patient was sent back to [facility] on [facility]  taper.  Patient states that she has intermittent thoughts of harming herself.  She states that she thought about hanging herself with a belt yesterday.  She also thought about walking on the train track and have the train run over her.  Patient also states that she wants to jump off the second floor building.  Patient was IVC by [facility] and was sent here for psych evaluation.  Patient denies any previous psych admissions.  Patient states that she takes Lexapro  for depression   The history is provided by the patient.       Prior to Admission medications   Medication Sig Start Date End Date Taking? Authorizing Provider  amLODipine  (NORVASC ) 10 MG tablet Take 10 mg by mouth daily. 03/20/21   [provider]  amoxicillin -clavulanate (AUGMENTIN ) 875-125 MG tablet Take 1 tablet by mouth every 12 (twelve) hours. 02/20/24   Alva Larraine FALCON, PA-C  atenolol  (TENORMIN ) 50 MG tablet Take 50 mg by mouth daily. 03/28/21   [provider]  chlordiazePOXIDE  (LIBRIUM ) 25 MG capsule 50mg  PO TID x 1D, then 25-50mg  PO BID X 1D, then 25-50mg  PO QD X 1D 09/04/24   Randol Simmonds, MD  escitalopram  (LEXAPRO ) 10 MG tablet Take 1 tablet (10 mg total) by mouth daily. 04/14/21   Kendall Cathlyn Collum, MD  Esomeprazole Magnesium  (NEXIUM 24HR) 20 MG TBEC Take 20 mg by mouth daily.     [provider]  gabapentin  (NEURONTIN ) 100 MG capsule Take 1 capsule (100 mg total) by mouth 2 (two) times daily. 04/10/21   Sebastian Toribio GAILS, MD  hydrOXYzine  (ATARAX /VISTARIL ) 25 MG tablet Take 1 tablet (25 mg total) by mouth every 6 (six) hours as needed for anxiety (or CIWA score </= 10). 04/10/21   Sebastian Toribio GAILS, MD  levothyroxine  (SYNTHROID ) 75 MCG tablet Take 75 mcg by mouth every morning. 03/21/21   [provider]  lisinopril  (ZESTRIL ) 40 MG tablet Take 1 tablet (40 mg total) by mouth daily. 04/11/21   Sebastian Toribio GAILS, MD  Multiple Vitamin (MULTIVITAMIN WITH MINERALS) TABS tablet Take 1 tablet by mouth daily. 04/11/21   Sebastian Toribio GAILS, MD  ondansetron  (ZOFRAN ) 4 MG tablet Take 1 tablet (4 mg total) by mouth every 6 (six) hours. 02/20/24   Kehrli, Kelsey F, PA-C  ondansetron  (ZOFRAN -ODT) 8 MG disintegrating tablet Take 1 tablet (8 mg total) by mouth every 8 (eight) hours as needed for nausea or vomiting. 09/04/24   Randol Simmonds, MD  OVER THE COUNTER MEDICATION Place 1 drop into both eyes daily as needed (dry eyes). Dry eye drop    [provider]  oxyCODONE -acetaminophen  (PERCOCET/ROXICET) 5-325 MG tablet Take 1 tablet by mouth every 6 (six) hours as needed for severe pain (pain score 7-10). 02/20/24   Alva Larraine FALCON, PA-C  rosuvastatin  (CRESTOR ) 40 MG tablet Take 40 mg by mouth daily. 03/20/21  [provider]  tamsulosin  (FLOMAX ) 0.4 MG CAPS capsule Take 1 capsule (0.4 mg total) by mouth daily. 02/20/24   Kehrli, Kelsey F, PA-C  thiamine  100 MG tablet Take 1 tablet (100 mg total) by mouth daily. 04/11/21   Sebastian Toribio GAILS, MD    Allergies: Patient has no known allergies.    Review of Systems  Psychiatric/Behavioral:  Positive for suicidal ideas.   All other systems reviewed and are negative.   Updated Vital Signs BP (!) 156/109   Pulse 74   Temp 98.4 F (36.9 C)   Resp 18   SpO2 96%   Physical Exam Vitals and nursing note reviewed.   Constitutional:      Comments: Depressed  HENT:     Head: Normocephalic.     Nose: Nose normal.     Mouth/Throat:     Mouth: Mucous membranes are moist.  Eyes:     Extraocular Movements: Extraocular movements intact.     Pupils: Pupils are equal, round, and reactive to light.  Cardiovascular:     Rate and Rhythm: Normal rate and regular rhythm.     Pulses: Normal pulses.     Heart sounds: Normal heart sounds.  Pulmonary:     Effort: Pulmonary effort is normal.     Breath sounds: Normal breath sounds.  Abdominal:     General: Abdomen is flat.     Palpations: Abdomen is soft.  Musculoskeletal:        General: Normal range of motion.     Cervical back: Normal range of motion and neck supple.  Skin:    General: Skin is warm.     Capillary Refill: Capillary refill takes less than 2 seconds.  Neurological:     General: No focal deficit present.     Mental Status: She is oriented to person, place, and time.  Psychiatric:     Comments: Depressed     (all labs ordered are listed, but only abnormal results are displayed) Labs Reviewed  CBC WITH DIFFERENTIAL/PLATELET  COMPREHENSIVE METABOLIC PANEL WITH GFR  ETHANOL  SALICYLATE LEVEL  ACETAMINOPHEN  LEVEL  RAPID URINE DRUG SCREEN, HOSP PERFORMED    EKG: None  Radiology: No results found.   Procedures   Medications Ordered in the ED - No data to display                                  Medical Decision Making Maryln Eastham is a 58 y.o. female here presenting with depression and suicidal ideation.  Patient's [facility]C by [facility] and first exam was filled out by them.  Patient does admits to suicidal ideation and plan with me today.  Plan to get medical clearance labs and consult TTS.  9:56 PM Labs unremarkable.  Medically clear for psych eval  Problems Addressed: Suicidal ideation: acute illness or injury  Amount and/or Complexity of Data Reviewed Labs: ordered.     Final diagnoses:  None    ED  Discharge Orders     None          Patt Alm Macho, MD 09/05/24 2156

## 2024-09-06 ENCOUNTER — Other Ambulatory Visit (INDEPENDENT_AMBULATORY_CARE_PROVIDER_SITE_OTHER)
Admission: EM | Admit: 2024-09-06 | Discharge: 2024-09-10 | Disposition: A | Discharge status: Home / Self Care | Source: Intra-hospital

## 2024-09-06 DIAGNOSIS — F1094 Alcohol use, unspecified with alcohol-induced mood disorder: Secondary | ICD-10-CM | POA: Diagnosis present

## 2024-09-06 DIAGNOSIS — R45851 Suicidal ideations: Secondary | ICD-10-CM | POA: Insufficient documentation

## 2024-09-06 DIAGNOSIS — E039 Hypothyroidism, unspecified: Secondary | ICD-10-CM | POA: Insufficient documentation

## 2024-09-06 DIAGNOSIS — F332 Major depressive disorder, recurrent severe without psychotic features: Secondary | ICD-10-CM | POA: Insufficient documentation

## 2024-09-06 DIAGNOSIS — F411 Generalized anxiety disorder: Secondary | ICD-10-CM | POA: Insufficient documentation

## 2024-09-06 DIAGNOSIS — F1014 Alcohol abuse with alcohol-induced mood disorder: Secondary | ICD-10-CM | POA: Insufficient documentation

## 2024-09-06 DIAGNOSIS — F1020 Alcohol dependence, uncomplicated: Secondary | ICD-10-CM | POA: Diagnosis not present

## 2024-09-06 DIAGNOSIS — I10 Essential (primary) hypertension: Secondary | ICD-10-CM | POA: Insufficient documentation

## 2024-09-06 LAB — POCT URINE DRUG SCREEN - MANUAL ENTRY (I-SCREEN)
POC Amphetamine UR: NOT DETECTED
POC Buprenorphine (BUP): NOT DETECTED
POC Cocaine UR: NOT DETECTED
POC Marijuana UR: POSITIVE — AB
POC Methadone UR: NOT DETECTED
POC Methamphetamine UR: NOT DETECTED
POC Morphine: NOT DETECTED
POC Oxazepam (BZO): POSITIVE — AB
POC Oxycodone UR: NOT DETECTED
POC Secobarbital (BAR): NOT DETECTED

## 2024-09-06 LAB — RAPID URINE DRUG SCREEN, HOSP PERFORMED
Amphetamines: NOT DETECTED
Barbiturates: NOT DETECTED
Benzodiazepines: POSITIVE — AB
Cocaine: NOT DETECTED
Opiates: NOT DETECTED
Tetrahydrocannabinol: NOT DETECTED

## 2024-09-06 MED ORDER — ADULT MULTIVITAMIN W/MINERALS CH
1.0000 | ORAL_TABLET | Freq: Every day | ORAL | Status: DC
  Administered 2024-09-07 – 2024-09-10 (×4): 1 via ORAL
  Filled 2024-09-06 (×5): qty 1

## 2024-09-06 MED ORDER — HYDROXYZINE HCL 25 MG PO TABS
25.0000 mg | ORAL_TABLET | Freq: Four times a day (QID) | ORAL | Status: DC | PRN
  Administered 2024-09-06: 25 mg via ORAL
  Filled 2024-09-06: qty 1

## 2024-09-06 MED ORDER — HYDROXYZINE HCL 25 MG PO TABS: 25.0000 mg | ORAL_TABLET | Freq: Four times a day (QID) | ORAL | Status: AC | PRN

## 2024-09-06 MED ORDER — HYDROXYZINE HCL 25 MG PO TABS: 25.0000 mg | ORAL_TABLET | Freq: Three times a day (TID) | ORAL | Status: DC | PRN

## 2024-09-06 MED ORDER — ACETAMINOPHEN 325 MG PO TABS: 650.0000 mg | ORAL_TABLET | Freq: Four times a day (QID) | ORAL | Status: DC | PRN

## 2024-09-06 MED ORDER — LORAZEPAM 1 MG PO TABS
1.0000 mg | ORAL_TABLET | Freq: Every day | ORAL | Status: AC
  Administered 2024-09-10: 1 mg via ORAL
  Filled 2024-09-06: qty 1

## 2024-09-06 MED ORDER — OLANZAPINE 10 MG IM SOLR: 5.0000 mg | Freq: Three times a day (TID) | INTRAMUSCULAR | Status: DC | PRN

## 2024-09-06 MED ORDER — NICOTINE 21 MG/24HR TD PT24
21.0000 mg | MEDICATED_PATCH | Freq: Every day | TRANSDERMAL | Status: DC
  Filled 2024-09-06 (×3): qty 1

## 2024-09-06 MED ORDER — THIAMINE HCL 100 MG/ML IJ SOLN: 100.0000 mg | Freq: Once | INTRAMUSCULAR | Status: AC

## 2024-09-06 MED ORDER — OLANZAPINE 5 MG PO TBDP: 5.0000 mg | ORAL_TABLET | Freq: Three times a day (TID) | ORAL | Status: DC | PRN

## 2024-09-06 MED ORDER — ALUM & MAG HYDROXIDE-SIMETH 200-200-20 MG/5ML PO SUSP
30.0000 mL | ORAL | Status: DC | PRN
  Administered 2024-09-09: 30 mL via ORAL
  Filled 2024-09-06: qty 30

## 2024-09-06 MED ORDER — LORAZEPAM 1 MG PO TABS
1.0000 mg | ORAL_TABLET | Freq: Four times a day (QID) | ORAL | Status: AC
  Administered 2024-09-06 – 2024-09-07 (×4): 1 mg via ORAL
  Filled 2024-09-06 (×4): qty 1

## 2024-09-06 MED ORDER — LORAZEPAM 1 MG PO TABS
1.0000 mg | ORAL_TABLET | Freq: Three times a day (TID) | ORAL | Status: AC
  Administered 2024-09-07 – 2024-09-08 (×3): 1 mg via ORAL
  Filled 2024-09-06 (×3): qty 1

## 2024-09-06 MED ORDER — LORAZEPAM 1 MG PO TABS: 1.0000 mg | ORAL_TABLET | Freq: Four times a day (QID) | ORAL | Status: AC | PRN

## 2024-09-06 MED ORDER — TRAZODONE HCL 50 MG PO TABS
50.0000 mg | ORAL_TABLET | Freq: Every evening | ORAL | Status: DC | PRN
  Administered 2024-09-06 – 2024-09-09 (×4): 50 mg via ORAL
  Filled 2024-09-06 (×5): qty 1

## 2024-09-06 MED ORDER — LORAZEPAM 1 MG PO TABS
1.0000 mg | ORAL_TABLET | Freq: Two times a day (BID) | ORAL | Status: AC
  Administered 2024-09-08 – 2024-09-09 (×2): 1 mg via ORAL
  Filled 2024-09-06 (×2): qty 1

## 2024-09-06 MED ORDER — THIAMINE MONONITRATE 100 MG PO TABS
100.0000 mg | ORAL_TABLET | Freq: Every day | ORAL | Status: DC
  Administered 2024-09-07 – 2024-09-10 (×4): 100 mg via ORAL
  Filled 2024-09-06 (×4): qty 1

## 2024-09-06 MED ORDER — LOPERAMIDE HCL 2 MG PO CAPS: 2.0000 mg | ORAL_CAPSULE | ORAL | Status: AC | PRN

## 2024-09-06 MED ORDER — MAGNESIUM HYDROXIDE 400 MG/5ML PO SUSP
30.0000 mL | Freq: Every day | ORAL | Status: DC | PRN
  Administered 2024-09-07 – 2024-09-08 (×2): 30 mL via ORAL
  Filled 2024-09-06 (×2): qty 30

## 2024-09-06 NOTE — ED Notes (Signed)
 IVC documents given to the RN on duty.

## 2024-09-06 NOTE — ED Notes (Signed)
 Case  Number: [id number]  IVC documents received from [facility] Zone NS and moved to the blue zone.  3 copies of the IVC was verified and current and expires on 09/12/2024.

## 2024-09-06 NOTE — ED Notes (Signed)
 Case  Number: [id number]  IVC documents moved to the Purple Zone

## 2024-09-06 NOTE — Discharge Instructions (Signed)
 [facility] [place] Resource/Facility Type: Licensed Opioid Treatment Provider, Medically Assisted Treatment [address] [place], [place] [address] [phone] [email or web address] [facility] Resource/Facility Type: Certified Opioid Treatment Provider, Medically Assisted Treatment, Counseling, Outpatient Services [address] [place],  [place] [address] [phone] [email or web address] [facility] Resource/Facility Type: Buprenorphine/Suboxone Provider 2 Locations: [address] [place], [place] [address] [phone] [address] [place], [place] [address] [phone] [email or web address] [facility] Resource/Facility Type: Residential Treatment, Outpatient Services, Counseling, Addiction Services, Medically Assisted Treatment [address] [place], [place] [address] [phone] [email or web address]://[email or web address]/ [facility]  Resource/Facility Type: Licensed Opioid Treatment Provider, Medically Assisted Treatment [address] [place], [place] [address] [phone] Insurance/Self-Pay [email or web address]://[email or web address]/ [facility] Resource/Facility Type: Inpatient treatment [address] [place], [place] [address] [phone] Abstinence/ 12 step-based Insurance/Self-Pay, Medicare, Medicaid, Sliding scale, non-Insurance https://[email or web address]/locations/[email or web address] Resource/Facility Type: Buprenorphine Provider, Medically Assisted Treatment [address]. [address] [place], [place] [address] [phone] [email or web address] [facility] Resource/Facility Type: Residential Treatment, Counseling, Buprenorphine Provider, Medically Assisted Treatment [address] [place], [place]  [address] [phone] [email or web address] [facility]. Resource/Facility Type: Buprenorphine Provider, Medically Assisted Treatment [address] [place], [place] [address] [phone] [email or web address] Tillman HERO. Caravaglia, D.O. Resource/Facility Type: [facility] Provider [address] [place], [place] [address] [phone] [facility] Resource/Facility Type: Shelter, Emergency Shelter, Resources, [employer], etc. [address] [place], [place] [address] [phone] [email or web address]://[email or web address]/ [facility] and Facility Based Crisis Center Resource/Facility Type: Behavioral/Mental Health Services, Addiction Services, Urgent Care, Outpatient Services [address] [place], [place] [address] [phone] [email or web address] [facility] Resource/Facility Type: Health Department, Harm Reduction [address] [place], [place] [address] [phone] [email or web address]://[email or web address]/ [facility] STOP Resource/Facility Type: Syringe Services Program, Harm Reduction, Peer-Support 2 Locations: [facility] [address] [place], [place] [address] [phone] WED 2pm-5pm, THU 4pm-8pm Call/text to setup exchange [email or web address] [facility] Parking Lot [address] [place], [place] [address] TUE 11am-1pm, [place] 11am-1pm [email or web address] [facility]  [facility] [place] Resource/Facility Type: Licensed Opioid Treatment Provider, Medically Assisted Treatment [address] [place], [place] [address] [phone] https://www.[email or web address]/treatment-center-locations/[place]-[facility]/ [facility] (Formerly Incarcerated Transition Program) Resource/Facility Type:  Dealer, Wellness program, employment assistance, healthcare, substance use and mental health recovery, etc. Multiple Locations/Programs serving 7 counties: [facility] [place]: [email or web address] [phone] Steffan Specking: [email or web address] [phone] Sharlet Dove: [email or web address] [facility] Resource/Facility Type: Harm Reduction, SSP [address], [place], [place] [address] [phone] https://www.[email or web address] (Will serve anyone, not just [facility] residents) [facility] Resource/Facility Type: Buprenorphine Provider, Medically Assisted Treatment [address] [place], [place] [address] [email or web address]://[email or web address]/ [facility] Resource/Facility Type: Buprenorphine Provider, Medically Assisted Treatment [address] [place], [place] [address] [phone] [facility] Resource/Facility Type: Shelter, Homeless Shelter, Overnight Shelter [address] [place], [place] [address] [phone] [email or web address] [facility]  (Men) [address] [place], [place] [address] [phone] Check for available beds/vacancies here: https://www.oxfordvacancies.com/ [employer] (Women) [address] [place], [place] [address] [phone] Check for available beds/vacancies here: https://www.oxfordvacancies.com/ [employer] (Men) [address] [place], [place] [address] [phone] Check for available beds/vacancies here: https://www.oxfordvacancies.com/ [facility] (Men) [address] [place], [place] [address] [phone] Check for available beds/vacancies here: https://www.oxfordvacancies.com/ [facility] (Men) [address] [place], [place] [address]  [phone] Check for  available beds/vacancies here: https://www.oxfordvacancies.com/ [facility] (Women) [address] [place], [place] [address] [phone] Check for available beds/vacancies here: https://www.oxfordvacancies.com/ [facility] (Men) [address] [place], [place] [address] [phone] Check for available beds/vacancies here: https://www.oxfordvacancies.com/ [facility] (Men) [address], [place] [address] [phone] Check for available beds/vacancies here: https://www.oxfordvacancies.com/ [facility] (Women) [address] [place], [place] [address] [phone] Check for available beds/vacancies here: https://www.oxfordvacancies.com/ [employer] (Women) [address] [place], [place] [address] [phone] Check for available beds/vacancies here: https://www.oxfordvacancies.com/ [employer] (Women) [address] [place], [place] [address] [phone] Check for available beds/vacancies here: https://www.oxfordvacancies.com/ [facility] [place] (Women) [address] [place], [place] [address] [phone] Check for available beds/vacancies here: https://www.oxfordvacancies.com/ [employer] (Men) [phone] [address] [place], [place] [address] [phone] Check for available beds/vacancies here: https://www.oxfordvacancies.com/ [employer] (Women) [address] [place], [place] [address] [phone] Check for available beds/vacancies here: https://www.oxfordvacancies.com/ [facility] (Women) [address] [place], [place] [address] [phone] Check for available beds/vacancies here: https://www.oxfordvacancies.com/ [facility] (Men) [address] [place], [place] [address] [phone] Check for available beds/vacancies here: https://www.oxfordvacancies.com/ [employer] (Men) [address] [place], [place] [address] [phone] Check for available  beds/vacancies here: https://www.oxfordvacancies.com/ [facility] (Men) [address] [place], [place] [address] [phone] Check for available beds/vacancies here: https://www.oxfordvacancies.com/ [employer] (Men) [address] [place], [place] [address] [phone] Check for available beds/vacancies here: https://www.oxfordvacancies.com/ [facility] [address] (Men) [address] [place], [place] [address] [phone] Check for available beds/vacancies here: https://www.oxfordvacancies.com/ [employer] (Men) [address] [place], [place] [address] [phone] Check for available beds/vacancies here: https://www.oxfordvacancies.com/ [employer] (Women) [address] [place], [place] [address] [phone] Check for available beds/vacancies here: https://www.oxfordvacancies.com/ [employer] (Men) [address] [place], [place] [address] [phone] Check for available beds/vacancies here: https://www.oxfordvacancies.com/ [place] (Men) [address]. [place], [place] [address] [phone] Check for available beds/vacancies here: https://www.oxfordvacancies.com/ [address] (Men) [address] [place], [place] [address] [phone] Check for available beds/vacancies here: https://www.oxfordvacancies.com/ [facility] (Men) [address] [place], [place] [address] [phone] Check for available beds/vacancies here: https://www.oxfordvacancies.com/ [employer] (Men) [address] [place], [place] [address] [phone] Check for available beds/vacancies here: https://www.oxfordvacancies.com/ [employer] (Men) [address] [place], [place] [address] [phone] Check for available beds/vacancies here: https://www.oxfordvacancies.com/ [facility] [address] (Men) [address] [place], [place] [address] [phone] Check for available beds/vacancies here:  https://www.oxfordvacancies.com/ [employer] (Men) [address] [place], [place] [address] [phone] Check for available beds/vacancies here: https://www.oxfordvacancies.com/ [employer] (Men) [address] [place], [place] [address] [phone] Check for available beds/vacancies here: https://www.oxfordvacancies.com/ [facility] (Women) [address] [place], [place] [address] [phone] Check for available beds/vacancies here: https://www.oxfordvacancies.com/ [employer] (Men) [address] [place], [place] [address] [phone] Check for available beds/vacancies here: https://www.oxfordvacancies.com/ [employer] (Men) [address] [place], [place] [address] [phone] Check for available beds/vacancies here: https://www.oxfordvacancies.com/ [facility] (Men) [address] [place], [place] [address] [phone] Check for available beds/vacancies here: https://www.oxfordvacancies.com/ [facility] Resource/Facility Type: Buprenorphine Provider, Medically Assisted Treatment [address] [place], [place] [address] [phone] [email or web address] [facility] Resource/Facility Type: Behavioral Health, Outpatient Services, Addiction Services [address], [place], [place] [address] [phone] https://[email or web address]/ [facility]  [facility] Resource/Facility Type: IOP, OP [address] [place], [place] [address] [phone] Insurance/Self-Pay Room at the [facility] Resource/Facility Type: Shelter, Family Dollar Stores, Programs for pregnant women, Resources, etc. [address] [place], [place] [address] [phone] https://www.[email or web address]/ [facility] Resource/Facility TypeEconomist, Harm Reduction [address] [place], [place] [address] TUE  2pm-4pm, FRI 2pm-4pm [email or web address] [place] [facility] Resource/Facility Type: Emergency Shelter (women & children) [address] [place], [place] [address] [phone] [email or web address]://[email or web address]/ Disability Rights [place]  [address] [place], [place] [address] View this Resource Online Copyright  2025 Disability Rights [place]. All rights reserved. This document contains general information for educational purposes and should not be construed as legal advice. It is not intended to be a comprehensive statement of the law and may not reflect recent legal developments. If you have specific questions concerning any matter contained in this document or need legal advice, we encourage you to consult with an attorney. [email or web address].dk Local: [phone] Toll Free (within [place]): [phone] (TTY) Relay Service: [phone] for English [phone] for Spanish

## 2024-09-06 NOTE — ED Notes (Signed)
 Patient transferred from [facility] to [facility] requesting assistance in alcohol abuse and a reported sucide attempt (patient denies). Calm, cooperative throughout interview process. Skin assessment completed. Oriented to unit. Meal and drink offered. Patient alert & oriented x4. Denies intent to harm self or others when asked. Denies A/VH. Patient reports pain in head rating 5/10 from a reported fall in which she does not remember. Patient reports her last bowel movement was last Tuesday (11/4) which she states is not normal for her, patient requests something to assist in this. No acute distress noted. Support and encouragement provided. Routine safety checks conducted per facility protocol. Encouraged patient to notify staff if any thoughts of harm towards self or others arise. Patient verbalizes understanding and agreement.

## 2024-09-06 NOTE — ED Notes (Signed)
 Pt placed in private room for TTS interview. MD on tele now.

## 2024-09-06 NOTE — ED Notes (Signed)
 Pt is sleeping at the moment. No acute distress noted.

## 2024-09-06 NOTE — Consult Note (Addendum)
 [facility] Consult Note  Patient Name: Victoria Patel MRN: [id number] DOB: June 03, 1966 DATE OF Consult: 09/06/2024  PRIMARY PSYCHIATRIC DIAGNOSES   1.  Major Depression, Recurrent, Severe without Psychotic Features 2.  Alcohol Use Disorder, Severe with Dependence   RECOMMENDATIONS  Recommendations: Medication recommendations: Lexapro , 20 mg every day for depression/anxiety.  Continue hydroxyzine  25 mg q6h PRN anxiety, as well as CIWA protocol Non-Medication/therapeutic recommendations: Patient continues to struggle accepting the seriousness of her current situation, given her past history of suicide attempt, and thus continues to require close observation, per ED protocol, until can be safely admitted to Psychiatry.  Continue with matter-of-fact emotional support in ED, pending transfer. Is inpatient psychiatric hospitalization recommended for this patient? Yes (Explain why): Patient states that I'm only suicidal when I'm drinking, but in fact was having suicidal ideation with plans (more than one) in detox, and she has been drinking quite heavily recently, which was in fact what led to a previous overdose.  Given that her suicidal ideation is waxing and waning quite quickly, with possible actual attempt yesterday, with her severe depression, she continues to meet IVC criteria for psychiatric admission Is another care setting recommended for this patient? (examples may include Crisis Stabilization Unit, Residential/Recovery Treatment, ALF/SNF, Memory Care Unit)  No (Explain why): As abovd From a psychiatric perspective, is this patient appropriate for discharge to an outpatient setting/resource or other less restrictive environment for continued care?  No (Explain why): As above Follow-Up Telepsychiatry C/L services: We will sign off for now. Please re-consult our service if needed for any concerning changes in the patient's condition, discharge planning, or questions. Communication:  Treatment team members (and family members if applicable) who were involved in treatment/care discussions and planning, and with whom we spoke or engaged with via secure text/chat, include the following: Secure message sent to Dr. Midge, ED attending, and ED staff, outlining recommendations.  Thank you for involving us  in the care of this patient. If you have any additional questions or concerns, please call [phone] and ask for the provider on-call.   TELEPSYCHIATRY ATTESTATION & CONSENT   As the provider for this telehealth consult, I attest that I verified the patient's identity using two separate identifiers, introduced myself to the patient, provided my credentials, disclosed my location, and performed this encounter via a HIPAA-compliant, real-time, face-to-face, two-way, interactive audio and video platform and with the full consent and agreement of the patient (or guardian as applicable.)   Patient physical location: [name]. Telehealth provider physical location: home office in state of [place] .  Video start time: 0510h EST  Video end time: 0525h EST   Total time spent in this encounter was 40 minutes, including record review, clinical interview, behavior observations, discussion of impression and recommendations (including medications and hospitalization), and consultation/communication with relevant parties.   IDENTIFYING DATA  Victoria Patel is a 58 y.o. year-old female for whom a psychiatric consultation has been ordered by the primary provider. The patient was identified using two separate identifiers.  CHIEF COMPLAINT/REASON FOR CONSULT   I only get suicidal when I'm drunk.   HISTORY OF PRESENT ILLNESS (HPI)  The patient presents with longstanding history of depression, comorbid with severe Etoh dependence.  Patient has had a marked increase in EtOH consumption over past several months, leading to delirium tremens when trying to detox.  In this process, made several  suicidal statement and may have attempted to harm herself yesterday.  Patient has been drinking regularly since her late twenties, although she  has had some periods of sobriety when she has been active in Recovery.  Has had at least six rehab stays, and then while intoxicated she overdosed about five years ago, requiring a psychiatric stay.  Patient had been staying sober until about a year ago, when her sponsor relapsed, and she relapsed soon afterwards.  Now to the point of drinking a pint of vodka and two bottles of wine a day.  Has remained on Lexapro  for her chronic depression.  Went into DT's and was sent to local detox facility, but there she began speaking of suicidal ideation with plans to hang herself, jump off a balcony, or jump in front of a train.  Patient now minimizing the seriousness of the statements, although she admits that she made them.  States no drugs, but patient does have THC in UDS  No homicidal ideation.  No current sx's of delirium.    PAST PSYCHIATRIC HISTORY  As above Otherwise as per HPI above.  PAST MEDICAL HISTORY  Past Medical History:  Diagnosis Date   Anxiety    Depression    GERD (gastroesophageal reflux disease)    HTN (hypertension)    Hypothyroidism      HOME MEDICATIONS  Facility Ordered Medications  Medication   amLODipine  (NORVASC ) tablet 5 mg   And   benazepril (LOTENSIN) tablet 40 mg   atenolol  (TENORMIN ) tablet 50 mg   escitalopram  (LEXAPRO ) tablet 20 mg   pantoprazole  (PROTONIX ) EC tablet 40 mg   LORazepam  (ATIVAN ) tablet 1-4 mg   Or   LORazepam  (ATIVAN ) injection 1-4 mg   thiamine  (VITAMIN B1) tablet 100 mg   Or   thiamine  (VITAMIN B1) injection 100 mg   folic acid  (FOLVITE ) tablet 1 mg   multivitamin with minerals tablet 1 tablet   LORazepam  (ATIVAN ) tablet 0-4 mg   Followed by   NOREEN ON 09/07/2024] LORazepam  (ATIVAN ) tablet 0-4 mg   hydrOXYzine  (ATARAX ) tablet 25 mg   PTA Medications  Medication Sig   atenolol  (TENORMIN ) 50 MG  tablet Take 50 mg by mouth daily.   rosuvastatin  (CRESTOR ) 40 MG tablet Take 40 mg by mouth daily.   OVER THE COUNTER MEDICATION Place 1 drop into both eyes daily as needed (dry eyes). Dry eye drop   thiamine  100 MG tablet Take 1 tablet (100 mg total) by mouth daily.   ondansetron  (ZOFRAN -ODT) 8 MG disintegrating tablet Take 1 tablet (8 mg total) by mouth every 8 (eight) hours as needed for nausea or vomiting.   traZODone  (DESYREL ) 100 MG tablet Take 100 mg by mouth at bedtime.   Multiple Vitamins-Minerals (MULTIVITAMIN ADULT, MINERALS,) TABS TAKE ONE TABLET DAILY AT 8AM   chlordiazePOXIDE  (LIBRIUM ) 25 MG capsule 50mg  PO TID x 1D, then 25-50mg  PO BID X 1D, then 25-50mg  PO QD X 1D (Patient not taking: Reported on 09/05/2024)   carbamazepine (TEGRETOL) 200 MG tablet Take by mouth. TAKE ONE TABLET TWICE DAILY FOR 3 DAYS AT 8AM AND 9PM THEN 1/2 TABLET TWICE DAILY FOR 3 DAYS THEN 1/2 TABLET DAILY FOR 3 DAYS   Lexapro   ALLERGIES  No Known Allergies  SOCIAL & SUBSTANCE USE HISTORY  Social History   Socioeconomic History   Marital status: Married    Spouse name: Not on file   Number of children: Not on file   Years of education: Not on file   Highest education level: Not on file  Occupational History   Not on file  Tobacco Use   Smoking status: Never  Smokeless tobacco: Never  Substance and Sexual Activity   Alcohol use: Not Currently    Comment: pint a day   Drug use: Not Currently   Sexual activity: Yes    Birth control/protection: None  Other Topics Concern   Not on file  Social History Narrative   Not on file   Social Drivers of Health   Financial Resource Strain: Not on file  Food Insecurity: Low Risk  (07/16/2023)   Received from [facility]   Hunger Vital Sign    Within the past 12 months, you worried that your food would run out before you got money to buy more: Never true    Within the past 12 months, the food you bought just didn't last and you didn't have money to get  more. : Never true  Transportation Needs: No Transportation Needs (07/16/2023)   Received from [facility]    In the past 12 months, has lack of reliable transportation kept you from medical appointments, meetings, work or from getting things needed for daily living? : No  Physical Activity: Not on file  Stress: Not on file  Social Connections: Not on file   Social History   Tobacco Use  Smoking Status Never  Smokeless Tobacco Never   Social History   Substance and Sexual Activity  Alcohol Use Not Currently   Comment: pint a day   Social History   Substance and Sexual Activity  Drug Use Not Currently    Additional pertinent information Works as a production designer, theatre/television/film of a customer service manager. [employer]  FAMILY HISTORY  History reviewed. No pertinent family history. Family Psychiatric History (if known):  Did not report  MENTAL STATUS EXAM (MSE)  Mental Status Exam: General Appearance: Fairly Groomed  Orientation:  Full (Time, Place, and Person)  Memory:  Immediate;   Fair Recent;   Fair Remote;   Fair  Concentration:  Concentration: Fair and Attention Span: Fair  Recall:  Fair  Attention  Fair  Eye Contact:  Fair  Speech:  Clear and Coherent and Normal Rate  Language:  Good  Volume:  Normal  Mood: I'm not doing as bad as I was  Affect:  Depressed and Tearful  Thought Process:  Coherent  Thought Content:  Logical  Suicidal Thoughts:  Yes.  with intent/plan  Homicidal Thoughts:  No  Judgement:  Impaired  Insight:  Shallow  Psychomotor Activity:  Normal  Akathisia:  Negative  Fund of Knowledge:  Good    Assets:  Communication Skills Desire for Improvement Financial Resources/Insurance Housing Social Support Vocational/Educational  Cognition:  WNL  ADL's:  Intact  AIMS (if indicated):       VITALS  Blood pressure 106/78, pulse 76, temperature 98.1 F (36.7 C), temperature source Oral, resp. rate 16, SpO2 96%.  LABS  Admission on 09/05/2024  Component Date  Value Ref Range Status   WBC 09/05/2024 5.7  4.0 - 10.5 K/uL Final   RBC 09/05/2024 3.72 (L)  3.87 - 5.11 MIL/uL Final   Hemoglobin 09/05/2024 12.3  12.0 - 15.0 g/dL Final   HCT 88/91/7974 37.3  36.0 - 46.0 % Final   MCV 09/05/2024 100.3 (H)  80.0 - 100.0 fL Final   MCH 09/05/2024 33.1  26.0 - 34.0 pg Final   MCHC 09/05/2024 33.0  30.0 - 36.0 g/dL Final   RDW 88/91/7974 12.1  11.5 - 15.5 % Final   Platelets 09/05/2024 175  150 - 400 K/uL Final   nRBC 09/05/2024 0.0  0.0 - 0.2 % Final   Neutrophils Relative % 09/05/2024 54  % Final   Neutro Abs 09/05/2024 3.1  1.7 - 7.7 K/uL Final   Lymphocytes Relative 09/05/2024 32  % Final   Lymphs Abs 09/05/2024 1.8  0.7 - 4.0 K/uL Final   Monocytes Relative 09/05/2024 10  % Final   Monocytes Absolute 09/05/2024 0.6  0.1 - 1.0 K/uL Final   Eosinophils Relative 09/05/2024 3  % Final   Eosinophils Absolute 09/05/2024 0.2  0.0 - 0.5 K/uL Final   Basophils Relative 09/05/2024 1  % Final   Basophils Absolute 09/05/2024 0.0  0.0 - 0.1 K/uL Final   Immature Granulocytes 09/05/2024 0  % Final   Abs Immature Granulocytes 09/05/2024 0.01  0.00 - 0.07 K/uL Final   Performed at [facility], [address] [place], [place] [address]   Sodium 09/05/2024 138  135 - 145 mmol/L Final   Potassium 09/05/2024 3.6  3.5 - 5.1 mmol/L Final   Chloride 09/05/2024 102  98 - 111 mmol/L Final   CO2 09/05/2024 28  22 - 32 mmol/L Final   Glucose, Bld 09/05/2024 95  70 - 99 mg/dL Final   Glucose reference range applies only to samples taken after fasting for at least 8 hours.   BUN 09/05/2024 8  6 - 20 mg/dL Final   Creatinine, Ser 09/05/2024 0.76  0.44 - 1.00 mg/dL Final   Calcium  09/05/2024 9.7  8.9 - 10.3 mg/dL Final   Total Protein 88/91/7974 5.5 (L)  6.5 - 8.1 g/dL Final   Albumin 88/91/7974 3.1 (L)  3.5 - 5.0 g/dL Final   AST 88/91/7974 17  15 - 41 U/L Final   ALT 09/05/2024 15  0 - 44 U/L Final   Alkaline Phosphatase 09/05/2024 41  38 - 126 U/L Final   Total  Bilirubin 09/05/2024 0.8  0.0 - 1.2 mg/dL Final   GFR, Estimated 09/05/2024 >60  >60 mL/min Final   Comment: (NOTE) Calculated using the CKD-EPI Creatinine Equation (2021)    Anion gap 09/05/2024 8  5 - 15 Final   Performed at [facility], [address], [place], [place] [address]   Alcohol, Ethyl (B) 09/05/2024 <15  <15 mg/dL Final   Comment: (NOTE) For medical purposes only. Performed at [facility], [address] [place], [place] [address]    Salicylate Lvl 09/05/2024 <7.0 (L)  7.0 - 30.0 mg/dL Final   Performed at [facility], [address], [place], [place] [address]   Acetaminophen  (Tylenol ), Serum 09/05/2024 <10 (L)  10 - 30 ug/mL Final   Comment: (NOTE) Therapeutic concentrations vary significantly. A range of 10-30 ug/mL  may be an effective concentration for many patients. However, some  are best treated at concentrations outside of this range. Acetaminophen  concentrations >150 ug/mL at 4 hours after ingestion  and >50 ug/mL at 12 hours after ingestion are often associated with  toxic reactions.  Performed at [facility], [address] [place], [place] [address]     PSYCHIATRIC REVIEW OF SYSTEMS (ROS)  ROS: Notable for the following relevant positive findings: Review of Systems  Constitutional: Negative.   HENT: Negative.    Eyes: Negative.   Respiratory: Negative.    Cardiovascular: Negative.   Gastrointestinal: Negative.   Genitourinary: Negative.   Musculoskeletal: Negative.   Skin: Negative.   Neurological: Negative.   Endo/Heme/Allergies: Negative.   Psychiatric/Behavioral:  Positive for depression, substance abuse and suicidal  ideas. The patient is nervous/anxious.     Additional findings:      Musculoskeletal: No abnormal movements observed      Gait & Station: Normal      Pain Screening: Denies      Nutrition & Dental Concerns: Reviewed  RISK FORMULATION/ASSESSMENT  Is the patient experiencing any suicidal or  homicidal ideations: Yes       Explain if yes:   Patient has had waning and waxing suicidal ideation with intent, with possible suicide attempt yesterday when was not intoxicated, and still with depression and intense feelings of shame (and past history of overdose).  Protective factors considered for safety management:   Patient has not been able to maintain sobriety and safety at home.  Risk factors/concerns considered for safety management:  Prior attempt Depression Substance abuse/dependence Access to lethal means Hopelessness Impulsivity Unmarried  Is there a safety management plan with the patient and treatment team to minimize risk factors and promote protective factors: No           Explain: Patient struggling to come to grips with seriousness of her suicidal ideation and the continued use of EtOH as an element of exacerbation.  Is crisis care placement or psychiatric hospitalization recommended: Yes     Based on my current evaluation and risk assessment, patient is determined at this time to be at:  High risk  *RISK ASSESSMENT Risk assessment is a dynamic process; it is possible that this patient's condition, and risk level, may change. This should be re-evaluated and managed over time as appropriate. Please re-consult psychiatric consult services if additional assistance is needed in terms of risk assessment and management. If your team decides to discharge this patient, please advise the patient how to best access emergency psychiatric services, or to call 911, if their condition worsens or they feel unsafe in any way.   Adriana JINNY Pontes, MD Telepsychiatry Consult Services

## 2024-09-06 NOTE — Group Note (Signed)
 Group Topic: Change and Accountability  Group Date: 09/06/2024 Start Time: 2000 End Time: 2100 Facilitators: Carollynn Genre, NT  Department: [facility]  Number of Participants: 7  Group Focus: activities of daily living skills Treatment Modality:  Family Therapy Interventions utilized were patient education Purpose: increase insight  Name: Ivanka Kirshner Date of Birth: [birth year]  MR: [id number]    Level of Participation: active Quality of Participation: attentive Interactions with others: good Mood/Affect: appropriate Triggers (if applicable): N a Cognition: coherent/clear Progress: Gaining insight Responn:d good   Patients Problems:  Patient Active Problem List   Diagnosis Date Noted   Severe episode of recurrent major depressive disorder, without psychotic features (HCC) 09/06/2024   Alcohol-induced mood disorder (HCC) 09/06/2024   Schizoaffective disorder (HCC) 04/10/2021   Ingestion of substance    Hypomagnesemia    Hypothyroidism 04/09/2021   QT prolongation 04/09/2021   Drug overdose 04/08/2021   Essential hypertension 04/08/2021   Suicidal ideation 04/08/2021   Hypokalemia 04/08/2021   Alcohol abuse 04/08/2021   Major depressive disorder, single episode, severe (HCC) 04/08/2021   Alcohol use disorder, severe, dependence (HCC) 04/08/2021   Abnormal EKG    Alcoholic intoxication without complication

## 2024-09-07 MED ORDER — ATENOLOL 25 MG PO TABS: 50.0000 mg | ORAL_TABLET | Freq: Every day | ORAL | Status: DC

## 2024-09-07 MED ORDER — BENAZEPRIL HCL 20 MG PO TABS
40.0000 mg | ORAL_TABLET | Freq: Every day | ORAL | Status: DC
  Administered 2024-09-07 – 2024-09-10 (×4): 40 mg via ORAL
  Filled 2024-09-07 (×4): qty 2

## 2024-09-07 MED ORDER — CLONIDINE HCL 0.1 MG PO TABS: 0.1000 mg | ORAL_TABLET | Freq: Two times a day (BID) | ORAL | Status: DC | PRN

## 2024-09-07 MED ORDER — CLONIDINE HCL 0.1 MG PO TABS
0.1000 mg | ORAL_TABLET | Freq: Once | ORAL | Status: AC
  Administered 2024-09-07: 0.1 mg via ORAL
  Filled 2024-09-07: qty 1

## 2024-09-07 MED ORDER — AMLODIPINE BESYLATE 5 MG PO TABS
5.0000 mg | ORAL_TABLET | Freq: Every day | ORAL | Status: DC
  Administered 2024-09-07 – 2024-09-10 (×4): 5 mg via ORAL
  Filled 2024-09-07 (×4): qty 1

## 2024-09-07 NOTE — Group Note (Signed)
 Group Topic: Wellness  Group Date: 09/07/2024 Start Time: 1200 End Time: 1230 Facilitators: Daved Tinnie HERO, RN  Department: [facility]  Number of Participants: 8  Group Focus: nursing group Treatment Modality:  Psychoeducation Interventions utilized were patient education Purpose: increase insight  Name: Victoria Patel Date of Birth: 12-16-1965  MR: [id number]    Level of Participation: moderate Quality of Participation: cooperative Interactions with others: gave feedback Mood/Affect: appropriate Triggers (if applicable): n/a Cognition: coherent/clear Progress: Gaining insight Response: pt expressed understanding, further questions denied  Plan: patient will be encouraged to attend future RN education groups  Patients Problems:  Patient Active Problem List   Diagnosis Date Noted   Severe episode of recurrent major depressive disorder, without psychotic features (HCC) 09/06/2024   Alcohol-induced mood disorder (HCC) 09/06/2024   Schizoaffective disorder (HCC) 04/10/2021   Ingestion of substance    Hypomagnesemia    Hypothyroidism 04/09/2021   QT prolongation 04/09/2021   Drug overdose 04/08/2021   Essential hypertension 04/08/2021   Suicidal ideation 04/08/2021   Hypokalemia 04/08/2021   Alcohol abuse 04/08/2021   Major depressive disorder, single episode, severe (HCC) 04/08/2021   Alcohol use disorder, severe, dependence (HCC) 04/08/2021   Abnormal EKG    Alcoholic intoxication without complication

## 2024-09-07 NOTE — Care Management (Addendum)
 [facility] Care Management...   Addendum 10:54 am Per Harlene @ [facility]  The patient is unable to return at this present time due the recent SI. Patient would need to stable.  Writer will refer patient to other inpatient/residential facilities   Addendum 9:14 am  Writer received return call from [place] at [facility]  She message treatment team and advised of patiens location and patient wanting to return their and complete the program.  Harlene will reach back out to writer  08:49 am Writer met with patient to discuss discharge planning  Patient reported being brought to [facility] via [facility] under IVC   Patient stated she would like to return to [facility] to continue in their program  Writer reached out to Administration and left HIPAA compliant message for return call

## 2024-09-07 NOTE — ED Notes (Signed)
 Patient alert and oriented.  Denies SI, HI, AVH, and pain. Scheduled medications administered to patient, per MD orders. Support and encouragement provided.  Routine safety checks conducted every 15 min.  Patient informed to notify staff with problems or concerns. No adverse drug reactions noted. Patient contracts for safety at this time. Patient compliant with medications and treatment plan. Patient receptive, calm, and cooperative. Patient interacts well with others on the unit.  Patient remains safe at this time.

## 2024-09-07 NOTE — ED Notes (Signed)
Pt attended AA group 

## 2024-09-07 NOTE — ED Notes (Signed)
 Paitent provided dinner.

## 2024-09-07 NOTE — Group Note (Signed)
 Group Topic: Wellness  Group Date: 09/07/2024 Start Time: 2030 End Time: 2100 Facilitators: Anice Benton LABOR, NT  Department: [facility]  Number of Participants: 5  Group Focus: check in and communication Treatment Modality:  Individual Therapy Interventions utilized were support Purpose: express feelings and regain self-worth  Name: [name] Date of Birth: 20-Mar-1966  MR: [id number]    Level of Participation: Did Not Attend  Quality of Participation: N/A Interactions with others: N/A Mood/Affect: N/A Triggers (if applicable): N/A Cognition: N/A Progress: None Response: N/A Plan: patient will be encouraged to attend groups   Patients Problems:  Patient Active Problem List   Diagnosis Date Noted   Severe episode of recurrent major depressive disorder, without psychotic features (HCC) 09/06/2024   Alcohol-induced mood disorder (HCC) 09/06/2024   Schizoaffective disorder (HCC) 04/10/2021   Ingestion of substance    Hypomagnesemia    Hypothyroidism 04/09/2021   QT prolongation 04/09/2021   Drug overdose 04/08/2021   Essential hypertension 04/08/2021   Suicidal ideation 04/08/2021   Hypokalemia 04/08/2021   Alcohol abuse 04/08/2021   Major depressive disorder, single episode, severe (HCC) 04/08/2021   Alcohol use disorder, severe, dependence (HCC) 04/08/2021   Abnormal EKG    Alcoholic intoxication without complication

## 2024-09-07 NOTE — ED Provider Notes (Signed)
 Facility Based Crisis Admission H&P  Date: 09/07/24 Patient Name: Victoria Patel MRN: [id number] Chief Complaint: Alcohol abuse & SI  Diagnoses:  Final diagnoses:  Alcohol-induced mood disorder with depressive symptoms (HCC)  GAD (generalized anxiety disorder)   HPI: Victoria Patel is a 58 y.o. female with a psychiatry history of alcohol use disorder, and a medical history of hypertension and hypothyroidism who presented to the [facility] ER on 11/7 under IVC from [facility] for AH-seeing butterflies, & suicidal ideations with multiple plans including hanging herself, jumping off a second floor building, walking on train tracks & hanging herself.  Patient was medically stabilized, and transferred to the [facility] behavioral health center, admitted to the facility based crisis treatment center on 11/9.  Patient assessment & Review of Psychiatry Symptoms: On assessment, patient is tearful, reports that suicidal ideations typically happen only in the context of alcohol use.  Reports that she has been sober for 2 years prior to relapsing, and using alcohol then trying to detox at [facility].  Patient reports a struggle with alcoholism spanning almost all of her lifetime, starting at age approximately 58 years old, with episodic sobriety.  She shares that for this episode, she started off by thinking that she could drink a glass of wine here and there, and it slowly became a daily happen which she found it difficult to break.  Patient reports not recalling what happened prior to her being taken to the [facility] ED, and shares that all she remembers is that she saw butterflies in her room at [facility], denies any history of complicated withdrawals from alcohol.  Denies a history of DTs in the past.  Denies a history of seizure activities in the past.  Patient presents with some mental clouding, seems not to be recalling most of her history, she is not able to quantify the amount of  alcohol that she drinks on a daily basis, but shares that she drinks a lot of wine, denies any other substance use.  Patient however, seems not to be forthcoming in reporting her history, because as per chart review, she has been hospitalized at the [facility] in the past, with the date of admission of 04/10/2021.  Diagnosis for that admission was MDD, alcohol use disorder, and suicidal ideation.  For the hospitalization at [facility] on 04/10/2021, patient had overdosed on 14 pills of Advil PM leading to her husband taking her to the [name] ED for a medical evaluation.  Below is an excerpt from that hospitalization: The patient and her husband stated that she had drank approximately half a liter of vodka over the last 24 hours.  She also consumed too many bottles and took approximately 14 Advil tablets. The above also indicates that patient might also not be forthcoming regarding what she is actually drinking.  Following that hospitalization, she was admitted to [facility] as well for rehabilitation. Psychiatric medication regimen at that time were fluoxetine , and escitalopram .  Writer could not locate any other psychiatric hospitalization in the chart, and patient denies any other ones.  Prior to this hospitalization, writer reached out to the admitting administrative coordinator to inquire if the [facility] was the right setting for patient at this time for treatment given her suicidal ideations, but was informed that the determination had already been made that the setting was appropriate for patient.  Patient reports medical history of high blood pressure.  Today, patient presents Pt with flat affect and depressed mood, attention to  personal hygiene and grooming is fair, eye contact is good, speech is clear & coherent. Thought contents are organized and logical, and pt currently denies SI/HI/AVH or paranoia. There is no evidence of delusional thoughts.  There are no overt signs of  psychosis.  Recommendations: Continue FBC admission and treatment plan as listed below:  PHQ 2-9:   Flowsheet Row ED from 09/06/2024 in [facility] ED from 09/05/2024 in [facility] Emergency Department at [facility] ED from 09/04/2024 in [facility] Emergency Department at [facility]  C-SSRS RISK CATEGORY Low Risk High Risk Low Risk    Screenings    Flowsheet Row Most Recent Value  CIWA-Ar Total 0    Total Time spent with patient: 45 minutes  Musculoskeletal  Strength & Muscle Tone: within normal limits Gait & Station: normal Patient leans: N/A  Psychiatric Specialty Exam  Presentation General Appearance: Casual  Eye Contact:Fair  Speech:Clear and Coherent  Speech Volume:Normal  Handedness:Right   Mood and Affect  Mood:Anxious; Depressed  Affect:Congruent   Thought Process  Thought Processes:Coherent  Descriptions of Associations:Intact  Orientation:Full (Time, Place and Person)  Thought Content:Logical  Diagnosis of Schizophrenia or Schizoaffective disorder in past: No data recorded  Hallucinations:Hallucinations: None  Ideas of Reference:None  Suicidal Thoughts:Suicidal Thoughts: No  Homicidal Thoughts:Homicidal Thoughts: No   Sensorium  Memory:Immediate Fair  Judgment:Fair  Insight:Fair   Executive Functions  Concentration:Fair  Attention Span:Fair  Recall:Fair  Fund of Knowledge:Fair  Language:Fair   Psychomotor Activity  Psychomotor Activity:Psychomotor Activity: Normal   Assets  Assets:Resilience   Sleep  Sleep:Sleep: Fair   Nutritional Assessment (For OBS and FBC admissions only) Has the patient had a weight loss or gain of 10 pounds or more in the last 3 months?: No Has the patient had a decrease in food intake/or appetite?: No Does the patient have dental problems?: No Does the patient have eating habits or behaviors that may be indicators of an eating disorder  including binging or inducing vomiting?: No Has the patient recently lost weight without trying?: 0 Has the patient been eating poorly because of a decreased appetite?: 0 Malnutrition Screening Tool Score: 0    Physical Exam Vitals reviewed.  Neurological:     General: No focal deficit present.     Mental Status: She is oriented to person, place, and time.    Review of Systems  Constitutional: Negative.   Psychiatric/Behavioral:  Positive for depression and substance abuse. Negative for hallucinations, memory loss and suicidal ideas. The patient is nervous/anxious and has insomnia.   All other systems reviewed and are negative.   Blood pressure (!) 140/108, pulse 85, temperature 98.2 F (36.8 C), temperature source Oral, resp. rate 18, SpO2 94%. There is no height or weight on file to calculate BMI.  Past Psychiatric History: Htn.   Is the patient at risk to self? No  Has the patient been a risk to self in the past 6 months? Yes .    Has the patient been a risk to self within the distant past? Yes   Is the patient a risk to others? No   Has the patient been a risk to others in the past 6 months? No   Has the patient been a risk to others within the distant past? Yes   Past Medical History: htn Family History: denies  Social History: Lives with husband  Last Labs:  Admission on 09/06/2024  Component Date Value Ref Range Status   POC Amphetamine  UR 09/06/2024 None Detected  NONE DETECTED (Cut Off Level 1000 ng/mL) Final   POC Secobarbital (BAR) 09/06/2024 None Detected  NONE DETECTED (Cut Off Level 300 ng/mL) Final   POC Buprenorphine (BUP) 09/06/2024 None Detected  NONE DETECTED (Cut Off Level 10 ng/mL) Final   POC Oxazepam (BZO) 09/06/2024 Positive (A)  NONE DETECTED (Cut Off Level 300 ng/mL) Final   POC Cocaine UR 09/06/2024 None Detected  NONE DETECTED (Cut Off Level 300 ng/mL) Final   POC Methamphetamine UR 09/06/2024 None Detected  NONE DETECTED (Cut Off Level 1000  ng/mL) Final   POC Morphine  09/06/2024 None Detected  NONE DETECTED (Cut Off Level 300 ng/mL) Final   POC Methadone UR 09/06/2024 None Detected  NONE DETECTED (Cut Off Level 300 ng/mL) Final   POC Oxycodone  UR 09/06/2024 None Detected  NONE DETECTED (Cut Off Level 100 ng/mL) Final   POC Marijuana UR 09/06/2024 Positive (A)  NONE DETECTED (Cut Off Level 50 ng/mL) Final  Admission on 09/05/2024, Discharged on 09/06/2024  Component Date Value Ref Range Status   WBC 09/05/2024 5.7  4.0 - 10.5 K/uL Final   RBC 09/05/2024 3.72 (L)  3.87 - 5.11 MIL/uL Final   Hemoglobin 09/05/2024 12.3  12.0 - 15.0 g/dL Final   HCT 88/91/7974 37.3  36.0 - 46.0 % Final   MCV 09/05/2024 100.3 (H)  80.0 - 100.0 fL Final   MCH 09/05/2024 33.1  26.0 - 34.0 pg Final   MCHC 09/05/2024 33.0  30.0 - 36.0 g/dL Final   RDW 88/91/7974 12.1  11.5 - 15.5 % Final   Platelets 09/05/2024 175  150 - 400 K/uL Final   nRBC 09/05/2024 0.0  0.0 - 0.2 % Final   Neutrophils Relative % 09/05/2024 54  % Final   Neutro Abs 09/05/2024 3.1  1.7 - 7.7 K/uL Final   Lymphocytes Relative 09/05/2024 32  % Final   Lymphs Abs 09/05/2024 1.8  0.7 - 4.0 K/uL Final   Monocytes Relative 09/05/2024 10  % Final   Monocytes Absolute 09/05/2024 0.6  0.1 - 1.0 K/uL Final   Eosinophils Relative 09/05/2024 3  % Final   Eosinophils Absolute 09/05/2024 0.2  0.0 - 0.5 K/uL Final   Basophils Relative 09/05/2024 1  % Final   Basophils Absolute 09/05/2024 0.0  0.0 - 0.1 K/uL Final   Immature Granulocytes 09/05/2024 0  % Final   Abs Immature Granulocytes 09/05/2024 0.01  0.00 - 0.07 K/uL Final   Performed at [facility], [address] [place], [place] [address]   Sodium 09/05/2024 138  135 - 145 mmol/L Final   Potassium 09/05/2024 3.6  3.5 - 5.1 mmol/L Final   Chloride 09/05/2024 102  98 - 111 mmol/L Final   CO2 09/05/2024 28  22 - 32 mmol/L Final   Glucose, Bld 09/05/2024 95  70 - 99 mg/dL Final   Glucose reference range applies only to samples  taken after fasting for at least 8 hours.   BUN 09/05/2024 8  6 - 20 mg/dL Final   Creatinine, Ser 09/05/2024 0.76  0.44 - 1.00 mg/dL Final   Calcium  09/05/2024 9.7  8.9 - 10.3 mg/dL Final   Total Protein 88/91/7974 5.5 (L)  6.5 - 8.1 g/dL Final   Albumin 88/91/7974 3.1 (L)  3.5 - 5.0 g/dL Final   AST 88/91/7974 17  15 - 41 U/L Final   ALT 09/05/2024 15  0 - 44 U/L Final   Alkaline Phosphatase 09/05/2024 41  38 - 126  U/L Final   Total Bilirubin 09/05/2024 0.8  0.0 - 1.2 mg/dL Final   GFR, Estimated 09/05/2024 >60  >60 mL/min Final   Comment: (NOTE) Calculated using the CKD-EPI Creatinine Equation (2021)    Anion gap 09/05/2024 8  5 - 15 Final   Performed at [facility], [address] [place], [place] [address]   Alcohol, Ethyl (B) 09/05/2024 <15  <15 mg/dL Final   Comment: (NOTE) For medical purposes only. Performed at [facility], [address], [place], [place] [address]    Salicylate Lvl 09/05/2024 <7.0 (L)  7.0 - 30.0 mg/dL Final   Performed at [facility], [address] [place], [place] [address]   Acetaminophen  (Tylenol ), Serum 09/05/2024 <10 (L)  10 - 30 ug/mL Final   Comment: (NOTE) Therapeutic concentrations vary significantly. A range of 10-30 ug/mL  may be an effective concentration for many patients. However, some  are best treated at concentrations outside of this range. Acetaminophen  concentrations >150 ug/mL at 4 hours after ingestion  and >50 ug/mL at 12 hours after ingestion are often associated with  toxic reactions.  Performed at [facility], [address] [place], [place] [address]    Opiates 09/06/2024 NONE DETECTED  NONE DETECTED Final   Cocaine 09/06/2024 NONE DETECTED  NONE DETECTED Final   Benzodiazepines 09/06/2024 POSITIVE (A)  NONE DETECTED Final   Amphetamines 09/06/2024 NONE DETECTED  NONE DETECTED Final   Tetrahydrocannabinol 09/06/2024 NONE DETECTED  NONE DETECTED Final   Barbiturates 09/06/2024 NONE DETECTED   NONE DETECTED Final   Comment: (NOTE) DRUG SCREEN FOR MEDICAL PURPOSES ONLY.  IF CONFIRMATION IS NEEDED FOR ANY PURPOSE, NOTIFY LAB WITHIN 5 DAYS.  LOWEST DETECTABLE LIMITS FOR URINE DRUG SCREEN Drug Class                     Cutoff (ng/mL) Amphetamine and metabolites    1000 Barbiturate and metabolites    200 Benzodiazepine                 200 Opiates and metabolites        300 Cocaine and metabolites        300 THC                            50 Performed at [facility], [address], [place], [place] [address]   Admission on 09/04/2024, Discharged on 09/04/2024  Component Date Value Ref Range Status   Sodium 09/04/2024 144  135 - 145 mmol/L Final   Potassium 09/04/2024 4.0  3.5 - 5.1 mmol/L Final   Chloride 09/04/2024 110  98 - 111 mmol/L Final   CO2 09/04/2024 28  22 - 32 mmol/L Final   Glucose, Bld 09/04/2024 107 (H)  70 - 99 mg/dL Final   Glucose reference range applies only to samples taken after fasting for at least 8 hours.   BUN 09/04/2024 14  6 - 20 mg/dL Final   Creatinine, Ser 09/04/2024 0.84  0.44 - 1.00 mg/dL Final   Calcium  09/04/2024 10.2  8.9 - 10.3 mg/dL Final   Total Protein 88/92/7974 6.0 (L)  6.5 - 8.1 g/dL Final   Albumin 88/92/7974 3.8  3.5 - 5.0 g/dL Final   AST 88/92/7974 23  15 - 41 U/L Final   ALT 09/04/2024 17  0 - 44 U/L Final   Alkaline Phosphatase 09/04/2024 65  38 -  126 U/L Final   Total Bilirubin 09/04/2024 0.3  0.0 - 1.2 mg/dL Final   GFR, Estimated 09/04/2024 >60  >60 mL/min Final   Comment: (NOTE) Calculated using the CKD-EPI Creatinine Equation (2021)    Anion gap 09/04/2024 6  5 - 15 Final   Performed at [facility], [address] [place], [place] [address]   Alcohol, Ethyl (B) 09/04/2024 <15  <15 mg/dL Final   Comment: (NOTE) For medical purposes only. Performed at [facility], [address] [place], [place] [address]    WBC 09/04/2024 5.1  4.0 - 10.5 K/uL Final   RBC  09/04/2024 3.90  3.87 - 5.11 MIL/uL Final   Hemoglobin 09/04/2024 12.3  12.0 - 15.0 g/dL Final   HCT 88/92/7974 39.3  36.0 - 46.0 % Final   MCV 09/04/2024 100.8 (H)  80.0 - 100.0 fL Final   MCH 09/04/2024 31.5  26.0 - 34.0 pg Final   MCHC 09/04/2024 31.3  30.0 - 36.0 g/dL Final   RDW 88/92/7974 12.3  11.5 - 15.5 % Final   Platelets 09/04/2024 195  150 - 400 K/uL Final   nRBC 09/04/2024 0.0  0.0 - 0.2 % Final   Performed at [facility], [address] [place], [place] [address]   Opiates 09/04/2024 NEGATIVE  NEGATIVE Final   Cocaine 09/04/2024 NEGATIVE  NEGATIVE Final   Benzodiazepines 09/04/2024 POSITIVE (A)  NEGATIVE Final   Amphetamines 09/04/2024 NEGATIVE  NEGATIVE Final   Tetrahydrocannabinol 09/04/2024 POSITIVE (A)  NEGATIVE Final   Barbiturates 09/04/2024 NEGATIVE  NEGATIVE Final   Methadone Scn, Ur 09/04/2024 NEGATIVE  NEGATIVE Final   Fentanyl 09/04/2024 NEGATIVE  NEGATIVE Final   Comment: (NOTE) Drug screen is for Medical Purposes only. Positive results are preliminary only. If confirmation is needed, notify lab within 5 days.  Drug Class                 Cutoff (ng/mL) Amphetamine and metabolites 1000 Barbiturate and metabolites 200 Benzodiazepine              200 Opiates and metabolites     300 Cocaine and metabolites     300 THC                         50 Fentanyl                    5 Methadone                   300  Trazodone  is metabolized in vivo to several metabolites,  including pharmacologically active m-CPP, which is excreted in the  urine.  Immunoassay screens for amphetamines and MDMA have potential  cross-reactivity with these compounds and may provide false positive  result.  Performed at [facility], [address], [place], [place] [address]     Allergies: Patient has no known allergies.  Medications:  Facility Ordered Medications  Medication   acetaminophen  (TYLENOL ) tablet 650 mg   alum & mag  hydroxide-simeth (MAALOX/MYLANTA) 200-200-20 MG/5ML suspension 30 mL   magnesium  hydroxide (MILK OF MAGNESIA) suspension 30 mL   OLANZapine (ZYPREXA) injection 5 mg   OLANZapine zydis (ZYPREXA) disintegrating tablet 5 mg   hydrOXYzine  (ATARAX ) tablet 25 mg   traZODone  (DESYREL ) tablet 50 mg   nicotine (NICODERM CQ - dosed in mg/24 hours) patch 21 mg   [COMPLETED] thiamine  (VITAMIN B1) injection 100 mg  thiamine  (VITAMIN B1) tablet 100 mg   multivitamin with minerals tablet 1 tablet   LORazepam  (ATIVAN ) tablet 1 mg   hydrOXYzine  (ATARAX ) tablet 25 mg   loperamide  (IMODIUM ) capsule 2-4 mg   [COMPLETED] LORazepam  (ATIVAN ) tablet 1 mg   Followed by   LORazepam  (ATIVAN ) tablet 1 mg   Followed by   NOREEN ON 09/08/2024] LORazepam  (ATIVAN ) tablet 1 mg   Followed by   NOREEN ON 09/10/2024] LORazepam  (ATIVAN ) tablet 1 mg   benazepril (LOTENSIN) tablet 40 mg   And   amLODipine  (NORVASC ) tablet 5 mg   cloNIDine  (CATAPRES ) tablet 0.1 mg   [COMPLETED] cloNIDine  (CATAPRES ) tablet 0.1 mg   PTA Medications  Medication Sig   atenolol  (TENORMIN ) 50 MG tablet Take 50 mg by mouth daily.   rosuvastatin  (CRESTOR ) 40 MG tablet Take 40 mg by mouth daily.   OVER THE COUNTER MEDICATION Place 1 drop into both eyes daily as needed (dry eyes). Dry eye drop   thiamine  100 MG tablet Take 1 tablet (100 mg total) by mouth daily.   chlordiazePOXIDE  (LIBRIUM ) 25 MG capsule 50mg  PO TID x 1D, then 25-50mg  PO BID X 1D, then 25-50mg  PO QD X 1D (Patient not taking: Reported on 09/05/2024)   ondansetron  (ZOFRAN -ODT) 8 MG disintegrating tablet Take 1 tablet (8 mg total) by mouth every 8 (eight) hours as needed for nausea or vomiting.   amLODipine -benazepril (LOTREL) 5-40 MG capsule Take 1 capsule by mouth daily.   escitalopram  (LEXAPRO ) 20 MG tablet Take 20 mg by mouth every morning.   esomeprazole (NEXIUM) 20 MG capsule Take 20 mg by mouth every morning.   traZODone  (DESYREL ) 100 MG tablet Take 100 mg by mouth at  bedtime.   Multiple Vitamins-Minerals (MULTIVITAMIN ADULT, MINERALS,) TABS TAKE ONE TABLET DAILY AT 8AM   carbamazepine (TEGRETOL) 200 MG tablet Take by mouth. TAKE ONE TABLET TWICE DAILY FOR 3 DAYS AT 8AM AND 9PM THEN 1/2 TABLET TWICE DAILY FOR 3 DAYS THEN 1/2 TABLET DAILY FOR 3 DAYS    Treatment Plan Summary: Daily contact with patient to assess and evaluate symptoms and progress in treatment and Medication management  Safety and Monitoring: Voluntary admission to inpatient psychiatric unit for safety, stabilization and treatment Daily contact with patient to assess and evaluate symptoms and progress in treatment Patient's case to be discussed in multi-disciplinary team meeting Observation Level : q15 minute checks Vital signs: q12 hours Precautions: Safety  Long Term Goal(s): Improvement in symptoms so as ready for discharge  Short Term Goals: Ability to identify changes in lifestyle to reduce recurrence of condition will improve, Ability to verbalize feelings will improve, Ability to disclose and discuss suicidal ideas, Ability to demonstrate self-control will improve, Ability to identify and develop effective coping behaviors will improve, Ability to maintain clinical measurements within normal limits will improve, Compliance with prescribed medications will improve, and Ability to identify triggers associated with substance abuse/mental health issues will improve  Diagnoses Principal Problem:   Alcohol-induced mood disorder (HCC)  Medications: -Continue Ativan  taper for alcohol use disorder-see the MAR for details - Start clonidine  0.1 mg twice daily for systolic blood pressure over 160 or diastolic blood pressure over 100 - Continue hydroxyzine  25 mg 3 times daily as needed for anxiety - Start Norvasc  5 mg daily-Home medication - Start benazepril 40 mg daily-Home medication -Continue trazodone  50 mg nightly as needed for sleep -Continue Tylenol  650 mg every 6 hours PRN for mild  pain -Continue Maalox 30 mg every 4 hrs PRN  for indigestion -Continue Milk of Magnesia as needed every 6 hrs for constipation  Labs reviewed: Ordered Lipid panel, TSH, hemoglobin A1c, vitamin D, B12.  Discharge Planning: Social work and case management to assist with discharge planning and identification of hospital follow-up needs prior to discharge Estimated LOS: 5-7 days Discharge Concerns: Need to establish a safety plan; Medication compliance and effectiveness Discharge Goals: Return home with outpatient referrals for mental health follow-up including medication management/psychotherapy  I certify that inpatient services furnished can reasonably be expected to improve the patient's condition.    Donia Snell, NP 11/10/20256:25 PM

## 2024-09-07 NOTE — ED Notes (Signed)
 Pt reports to feel 'mellow'. Pt denies si hi avh- verbal contract for safety provided. Pt c/o constipation, given prn MOM. Pt ate breakfast. RN reviewed medications, questions denied.

## 2024-09-07 NOTE — Group Note (Signed)
 Group Topic: Understanding Self  Group Date: 09/07/2024 Start Time: 1315 End Time: 1400 Facilitators: Alyse Leilani LABOR, NT  Department: [facility]  Number of Participants: 10  Group Focus: acceptance, activities of daily living skills, affirmation, and leisure skills Treatment Modality:  Leisure Development Interventions utilized were group exercise Purpose: enhance coping skills, regain self-worth, and reinforce self-care  Name: Victoria Patel Date of Birth: December 14, 1965  MR: [id number]    Level of Participation: active Quality of Participation: cooperative and engaged Interactions with others: gave feedback Mood/Affect: brightens with interaction and positive Triggers (if applicable): none Cognition: coherent/clear and insightful Progress: Gaining insight Response: really like talking with others Plan: patient will be encouraged to keep coming to groups  Patients Problems:  Patient Active Problem List   Diagnosis Date Noted   Severe episode of recurrent major depressive disorder, without psychotic features (HCC) 09/06/2024   Alcohol-induced mood disorder (HCC) 09/06/2024   Schizoaffective disorder (HCC) 04/10/2021   Ingestion of substance    Hypomagnesemia    Hypothyroidism 04/09/2021   QT prolongation 04/09/2021   Drug overdose 04/08/2021   Essential hypertension 04/08/2021   Suicidal ideation 04/08/2021   Hypokalemia 04/08/2021   Alcohol abuse 04/08/2021   Major depressive disorder, single episode, severe (HCC) 04/08/2021   Alcohol use disorder, severe, dependence (HCC) 04/08/2021   Abnormal EKG    Alcoholic intoxication without complication

## 2024-09-07 NOTE — ED Notes (Signed)
 pt is sleeping. No acute distress noted. Respirations are even and labored.

## 2024-09-08 DIAGNOSIS — F1094 Alcohol use, unspecified with alcohol-induced mood disorder: Secondary | ICD-10-CM | POA: Diagnosis not present

## 2024-09-08 DIAGNOSIS — F411 Generalized anxiety disorder: Secondary | ICD-10-CM | POA: Diagnosis not present

## 2024-09-08 LAB — LIPID PANEL
Cholesterol: 148 mg/dL (ref 0–200)
HDL: 59 mg/dL (ref >40)
LDL Cholesterol: 75 mg/dL (ref 0–99)
Total CHOL/HDL Ratio: 2.5 ratio
Triglycerides: 69 mg/dL (ref <150)
VLDL: 14 mg/dL (ref 0–40)

## 2024-09-08 LAB — VITAMIN B12: Vitamin B-12: 234 pg/mL (ref 180–914)

## 2024-09-08 LAB — HEMOGLOBIN A1C
Hgb A1c MFr Bld: 4.7 % — ABNORMAL LOW (ref 4.8–5.6)
Mean Plasma Glucose: 88.19 mg/dL

## 2024-09-08 LAB — TSH: TSH: 6.723 u[IU]/mL — ABNORMAL HIGH (ref 0.350–4.500)

## 2024-09-08 LAB — VITAMIN D 25 HYDROXY (VIT D DEFICIENCY, FRACTURES): Vit D, 25-Hydroxy: 49.72 ng/mL (ref 30–100)

## 2024-09-08 MED ORDER — SENNOSIDES-DOCUSATE SODIUM 8.6-50 MG PO TABS
1.0000 | ORAL_TABLET | Freq: Two times a day (BID) | ORAL | Status: DC
  Administered 2024-09-08 – 2024-09-09 (×2): 1 via ORAL
  Filled 2024-09-08 (×4): qty 1

## 2024-09-08 MED ORDER — MAGNESIUM CITRATE PO SOLN
1.0000 | Freq: Once | ORAL | Status: AC
  Administered 2024-09-08: 1 via ORAL
  Filled 2024-09-08: qty 296

## 2024-09-08 NOTE — Progress Notes (Signed)
   09/08/24 1040  Spiritual Encounters  Type of Visit Initial  Care provided to: Patient  Referral source Clinical staff  Reason for visit Routine spiritual support  OnCall Visit No  Interventions  Spiritual Care Interventions Made Established relationship of care and support;Compassionate presence;Prayer   I visited with Victoria Patel to offer spiritual care support upon suggestion by her care team.  [name] processed emotions around this experience of detox, calling it the hardest she has experienced. She processed feelings around not being able to return to [facility]. [name] expressed acceptance and reflected on what may be the potential benefits of an alternate recovery location and hope that another option exists. She reflected with gratitude on supportive spouse and meaning found in her faith. She spoke to worries about job but also reflected on husband's encouragement to put her recovery first.  I provided compassionate, non-anxious presence and both active and reflective listening. I facilitated meaning making and invited reflection that [name] herself already showed she is leaning into. I affirmed the positives she named, encouraged her self-care, and affirmed her faith. I offered prayer with [name]' consent.  Marygrace Sandoval L. Delores HERO.Div

## 2024-09-08 NOTE — ED Notes (Signed)
 Patient asleep, no CIWA assessment completed at this time.

## 2024-09-08 NOTE — ED Notes (Signed)
 Patient is in the bedroom calm and sleeping. NAD. Environment secured per policy. Will continue to monitor for safety.

## 2024-09-08 NOTE — ED Notes (Signed)
 Assumed care from [place], LPN. Patient asleep in the bedroom at the moment. NAD. Respirations even and unlabored. Will monitor for safety

## 2024-09-08 NOTE — Group Note (Signed)
 Group Topic: Healthy Self Image and Positive Change  Group Date: 09/08/2024 Start Time: 1600 End Time: 1645 Facilitators: Elnor Keven SAILOR  Department: [facility]  Number of Participants: 9  Group Focus: affirmation Treatment Modality:  Psychoeducation Interventions utilized were support Purpose: increase insight  Name: Victoria Patel Date of Birth: [birth year]  MR: [id number]    Level of Participation: minimal Quality of Participation: cooperative Interactions with others: gave feedback Mood/Affect: appropriate Triggers (if applicable): NA Cognition: coherent/clear Progress: Moderate Response: Pt shared that her process in this recovery looks different than what she expected, but she thinks that might be for the best. Pt chose and affirmation that states she is moving forward.  Plan: follow-up needed  Patients Problems:  Patient Active Problem List   Diagnosis Date Noted   Severe episode of recurrent major depressive disorder, without psychotic features (HCC) 09/06/2024   Alcohol-induced mood disorder (HCC) 09/06/2024   Schizoaffective disorder (HCC) 04/10/2021   Ingestion of substance    Hypomagnesemia    Hypothyroidism 04/09/2021   QT prolongation 04/09/2021   Drug overdose 04/08/2021   Essential hypertension 04/08/2021   Suicidal ideation 04/08/2021   Hypokalemia 04/08/2021   Alcohol abuse 04/08/2021   Major depressive disorder, single episode, severe (HCC) 04/08/2021   Alcohol use disorder, severe, dependence (HCC) 04/08/2021   Abnormal EKG    Alcoholic intoxication without complication

## 2024-09-08 NOTE — ED Provider Notes (Signed)
 Behavioral Health Progress Note  Date and Time: 09/08/2024 6:39 PM Name: Victoria Patel MRN:  [id number]  HPI: Victoria Patel is a 58 y.o. female with a psychiatry history of alcohol use disorder, and a medical history of hypertension and hypothyroidism who presented to the [facility] ER on 11/7 under IVC from [facility] for AH-seeing butterflies, & suicidal ideations with multiple plans including hanging herself, jumping off a second floor building, walking on train tracks & hanging herself.  Patient was medically stabilized, and transferred to the [facility] behavioral health center, admitted to the facility based crisis treatment center on 11/9.   Patient assessment note: Patient chart was reviewed, her case discussed with treatment team, and she seems to be in better spirits today. Vital signs much better today as compared to yesterday.  Seemed to have been related to withdrawals from alcohol coupled with anxiety related to not being able to go back to [facility].  Patient is coming to terms with the fact that she will not be going back there, and is beginning to work with CSW to go to [facility] treatment center.  She is reporting still feeling very anxious today, but objectively there is improvement, she denies suicidal ideations, continues to report that SI is only in the context of alcohol use.  She denies homicidal ideations, denies visual hallucinations, denies auditory disturbances.  Reports that her appetite is fair, and she is making efforts to eat.  She reports mild generalized body aches, but supportive medications are also being administered.  Patient's main concern today is her inability to move her bowels, and she would like a bowel prep, and she is agreeable to magnesium  citrate x 1 dose as well as combination of Colace/Senokot daily.  Patient has been educated to let staff know in the event that she develops diarrhea so that medication can be stopped.  Verbalizes  understanding.  Regarding alcohol physiological withdrawal symptoms, she has moderate tremors to bilateral forearms, but states that Ativan  is helpful.  Tolerating taper well with no concerns at this time.  Continuing to benefit from being at the [facility], and we will continue to make adjustments to medications to treat and stabilize symptoms prior to transitioning patient to rehabilitation center.  Tentative discharge date is 11/14.  Labs reviewed: TSH is 6.723, will order free T4 and free T3.  QTc is 443.  Continuing medications as listed below.  Diagnosis:  Final diagnoses:  Alcohol-induced mood disorder with depressive symptoms (HCC)  GAD (generalized anxiety disorder)   Total Time spent with patient: 45 minutes  leep: Good  Appetite:  Good  Current Medications:  Current Facility-Administered Medications  Medication Dose Route Frequency Provider Last Rate Last Admin   acetaminophen  (TYLENOL ) tablet 650 mg  650 mg Oral Q6H PRN Delina Kruczek, NP       alum & mag hydroxide-simeth (MAALOX/MYLANTA) 200-200-20 MG/5ML suspension 30 mL  30 mL Oral Q4H PRN Vegas Fritze, NP       benazepril (LOTENSIN) tablet 40 mg  40 mg Oral Daily Oretha Weismann, NP   40 mg at 09/08/24 9061   And   amLODipine  (NORVASC ) tablet 5 mg  5 mg Oral Daily Grace Haggart, NP   5 mg at 09/08/24 9061   cloNIDine  (CATAPRES ) tablet 0.1 mg  0.1 mg Oral BID PRN Tex Drilling, NP       hydrOXYzine  (ATARAX ) tablet 25 mg  25 mg Oral TID PRN Tex Drilling, NP       hydrOXYzine  (ATARAX ) tablet  25 mg  25 mg Oral Q6H PRN Tex Drilling, NP       loperamide  (IMODIUM ) capsule 2-4 mg  2-4 mg Oral PRN Amir Glaus, NP       LORazepam  (ATIVAN ) tablet 1 mg  1 mg Oral Q6H PRN Pernella Ackerley, NP       LORazepam  (ATIVAN ) tablet 1 mg  1 mg Oral BID Tex Drilling, NP       Followed by   NOREEN ON 09/10/2024] LORazepam  (ATIVAN ) tablet 1 mg  1 mg Oral Daily Clydie Dillen, NP       magnesium  citrate solution 1 Bottle  1 Bottle Oral Once  Terralyn Matsumura, NP       magnesium  hydroxide (MILK OF MAGNESIA) suspension 30 mL  30 mL Oral Daily PRN Tex Drilling, NP   30 mL at 09/08/24 0940   multivitamin with minerals tablet 1 tablet  1 tablet Oral Daily Tex Drilling, NP   1 tablet at 09/08/24 9061   nicotine (NICODERM CQ - dosed in mg/24 hours) patch 21 mg  21 mg Transdermal Q0600 Tex Drilling, NP       OLANZapine (ZYPREXA) injection 5 mg  5 mg Intramuscular TID PRN Tex Drilling, NP       OLANZapine zydis (ZYPREXA) disintegrating tablet 5 mg  5 mg Oral TID PRN Tex Drilling, NP       senna-docusate (Senokot-S) tablet 1 tablet  1 tablet Oral BID Tex Drilling, NP       thiamine  (VITAMIN B1) tablet 100 mg  100 mg Oral Daily Frankee Gritz, NP   100 mg at 09/08/24 9061   traZODone  (DESYREL ) tablet 50 mg  50 mg Oral QHS PRN Tex Drilling, NP   50 mg at 09/07/24 2155   Current Outpatient Medications  Medication Sig Dispense Refill   amLODipine -benazepril (LOTREL) 5-40 MG capsule Take 1 capsule by mouth daily.     atenolol  (TENORMIN ) 50 MG tablet Take 50 mg by mouth daily.     carbamazepine (TEGRETOL) 200 MG tablet Take by mouth. TAKE ONE TABLET TWICE DAILY FOR 3 DAYS AT 8AM AND 9PM THEN 1/2 TABLET TWICE DAILY FOR 3 DAYS THEN 1/2 TABLET DAILY FOR 3 DAYS     chlordiazePOXIDE  (LIBRIUM ) 25 MG capsule 50mg  PO TID x 1D, then 25-50mg  PO BID X 1D, then 25-50mg  PO QD X 1D (Patient not taking: Reported on 09/05/2024) 12 capsule 0   escitalopram  (LEXAPRO ) 20 MG tablet Take 20 mg by mouth every morning.     esomeprazole (NEXIUM) 20 MG capsule Take 20 mg by mouth every morning.     Multiple Vitamins-Minerals (MULTIVITAMIN ADULT, MINERALS,) TABS TAKE ONE TABLET DAILY AT 8AM     ondansetron  (ZOFRAN -ODT) 8 MG disintegrating tablet Take 1 tablet (8 mg total) by mouth every 8 (eight) hours as needed for nausea or vomiting. 12 tablet 0   OVER THE COUNTER MEDICATION Place 1 drop into both eyes daily as needed (dry eyes). Dry eye drop     rosuvastatin   (CRESTOR ) 40 MG tablet Take 40 mg by mouth daily.     thiamine  100 MG tablet Take 1 tablet (100 mg total) by mouth daily.     traZODone  (DESYREL ) 100 MG tablet Take 100 mg by mouth at bedtime.      Labs  Lab Results:  Admission on 09/06/2024  Component Date Value Ref Range Status   POC Amphetamine UR 09/06/2024 None Detected  NONE DETECTED (Cut Off Level 1000 ng/mL) Final   POC Secobarbital (BAR)  09/06/2024 None Detected  NONE DETECTED (Cut Off Level 300 ng/mL) Final   POC Buprenorphine (BUP) 09/06/2024 None Detected  NONE DETECTED (Cut Off Level 10 ng/mL) Final   POC Oxazepam (BZO) 09/06/2024 Positive (A)  NONE DETECTED (Cut Off Level 300 ng/mL) Final   POC Cocaine UR 09/06/2024 None Detected  NONE DETECTED (Cut Off Level 300 ng/mL) Final   POC Methamphetamine UR 09/06/2024 None Detected  NONE DETECTED (Cut Off Level 1000 ng/mL) Final   POC Morphine  09/06/2024 None Detected  NONE DETECTED (Cut Off Level 300 ng/mL) Final   POC Methadone UR 09/06/2024 None Detected  NONE DETECTED (Cut Off Level 300 ng/mL) Final   POC Oxycodone  UR 09/06/2024 None Detected  NONE DETECTED (Cut Off Level 100 ng/mL) Final   POC Marijuana UR 09/06/2024 Positive (A)  NONE DETECTED (Cut Off Level 50 ng/mL) Final   Cholesterol 09/08/2024 148  0 - 200 mg/dL Final   Triglycerides 88/88/7974 69  <150 mg/dL Final   HDL 88/88/7974 59  >40 mg/dL Final   Total CHOL/HDL Ratio 09/08/2024 2.5  RATIO Final   VLDL 09/08/2024 14  0 - 40 mg/dL Final   LDL Cholesterol 09/08/2024 75  0 - 99 mg/dL Final   Comment:        Total Cholesterol/HDL:CHD Risk Coronary Heart Disease Risk Table                     Men   Women  1/2 Average Risk   3.4   3.3  Average Risk       5.0   4.4  2 X Average Risk   9.6   7.1  3 X Average Risk  23.4   11.0        Use the calculated Patient Ratio above and the CHD Risk Table to determine the patient's CHD Risk.        ATP III CLASSIFICATION (LDL):  <100     mg/dL   Optimal  899-870  mg/dL    Near or Above                    Optimal  130-159  mg/dL   Borderline  839-810  mg/dL   High  >809     mg/dL   Very High Performed at [facility], [address] [place], [place] [address]    Hgb A1c MFr Bld 09/08/2024 4.7 (L)  4.8 - 5.6 % Final   Comment: (NOTE) Diagnosis of Diabetes The following HbA1c ranges recommended by the American Diabetes Association (ADA) may be used as an aid in the diagnosis of diabetes mellitus.  Hemoglobin             Suggested A1C NGSP%              Diagnosis  <5.7                   Non Diabetic  5.7-6.4                Pre-Diabetic  >6.4                   Diabetic  <7.0                   Glycemic control for                       adults with diabetes.     Mean Plasma  Glucose 09/08/2024 88.19  mg/dL Final   Performed at [facility], [address] [place], [place] [address]   Vit D, 25-Hydroxy 09/08/2024 49.72  30 - 100 ng/mL Final   Comment: (NOTE) Vitamin D deficiency has been defined by the Institute of Medicine  and an Endocrine Society practice guideline as a level of serum 25-OH  vitamin D less than 20 ng/mL (1,2). The Endocrine Society went on to  further define vitamin D insufficiency as a level between 21 and 29  ng/mL (2).  1. IOM (Institute of Medicine). 2010. Dietary reference intakes for  calcium  and D. [place]: [employer]. 2. Holick MF, [place], Bischoff-Ferrari HA, et al. Evaluation,  treatment, and prevention of vitamin D deficiency: an Endocrine  Society clinical practice guideline, JCEM. 2011 Jul; 96(7): 1911-30.  Performed at [facility], [address], [place], [place] [address]    Vitamin B-12 09/08/2024 234  180 - 914 pg/mL Final   Comment: (NOTE) This assay is not validated for testing neonatal or myeloproliferative syndrome specimens for Vitamin B12 levels. Performed at [facility], [address], [place], [place] [address]    TSH 09/08/2024 6.723 (H)   0.350 - 4.500 uIU/mL Final   Comment: Performed by a 3rd Generation assay with a functional sensitivity of <=0.01 uIU/mL. Performed at [facility], [address], [place], [place] [address]   Admission on 09/05/2024, Discharged on 09/06/2024  Component Date Value Ref Range Status   WBC 09/05/2024 5.7  4.0 - 10.5 K/uL Final   RBC 09/05/2024 3.72 (L)  3.87 - 5.11 MIL/uL Final   Hemoglobin 09/05/2024 12.3  12.0 - 15.0 g/dL Final   HCT 88/91/7974 37.3  36.0 - 46.0 % Final   MCV 09/05/2024 100.3 (H)  80.0 - 100.0 fL Final   MCH 09/05/2024 33.1  26.0 - 34.0 pg Final   MCHC 09/05/2024 33.0  30.0 - 36.0 g/dL Final   RDW 88/91/7974 12.1  11.5 - 15.5 % Final   Platelets 09/05/2024 175  150 - 400 K/uL Final   nRBC 09/05/2024 0.0  0.0 - 0.2 % Final   Neutrophils Relative % 09/05/2024 54  % Final   Neutro Abs 09/05/2024 3.1  1.7 - 7.7 K/uL Final   Lymphocytes Relative 09/05/2024 32  % Final   Lymphs Abs 09/05/2024 1.8  0.7 - 4.0 K/uL Final   Monocytes Relative 09/05/2024 10  % Final   Monocytes Absolute 09/05/2024 0.6  0.1 - 1.0 K/uL Final   Eosinophils Relative 09/05/2024 3  % Final   Eosinophils Absolute 09/05/2024 0.2  0.0 - 0.5 K/uL Final   Basophils Relative 09/05/2024 1  % Final   Basophils Absolute 09/05/2024 0.0  0.0 - 0.1 K/uL Final   Immature Granulocytes 09/05/2024 0  % Final   Abs Immature Granulocytes 09/05/2024 0.01  0.00 - 0.07 K/uL Final   Performed at [facility], [address] [place], [place] [address]   Sodium 09/05/2024 138  135 - 145 mmol/L Final   Potassium 09/05/2024 3.6  3.5 - 5.1 mmol/L Final   Chloride 09/05/2024 102  98 - 111 mmol/L Final   CO2 09/05/2024 28  22 - 32 mmol/L Final   Glucose, Bld 09/05/2024 95  70 - 99 mg/dL Final   Glucose reference range applies only to samples taken after fasting for at least 8 hours.   BUN 09/05/2024 8  6 - 20 mg/dL Final  Creatinine, Ser 09/05/2024 0.76  0.44 - 1.00 mg/dL Final   Calcium  09/05/2024 9.7  8.9 -  10.3 mg/dL Final   Total Protein 88/91/7974 5.5 (L)  6.5 - 8.1 g/dL Final   Albumin 88/91/7974 3.1 (L)  3.5 - 5.0 g/dL Final   AST 88/91/7974 17  15 - 41 U/L Final   ALT 09/05/2024 15  0 - 44 U/L Final   Alkaline Phosphatase 09/05/2024 41  38 - 126 U/L Final   Total Bilirubin 09/05/2024 0.8  0.0 - 1.2 mg/dL Final   GFR, Estimated 09/05/2024 >60  >60 mL/min Final   Comment: (NOTE) Calculated using the CKD-EPI Creatinine Equation (2021)    Anion gap 09/05/2024 8  5 - 15 Final   Performed at [facility], [address], [place], [place] [address]   Alcohol, Ethyl (B) 09/05/2024 <15  <15 mg/dL Final   Comment: (NOTE) For medical purposes only. Performed at [facility], [address], [place], [place] [address]    Salicylate Lvl 09/05/2024 <7.0 (L)  7.0 - 30.0 mg/dL Final   Performed at [facility], [address], [place], [place] [address]   Acetaminophen  (Tylenol ), Serum 09/05/2024 <10 (L)  10 - 30 ug/mL Final   Comment: (NOTE) Therapeutic concentrations vary significantly. A range of 10-30 ug/mL  may be an effective concentration for many patients. However, some  are best treated at concentrations outside of this range. Acetaminophen  concentrations >150 ug/mL at 4 hours after ingestion  and >50 ug/mL at 12 hours after ingestion are often associated with  toxic reactions.  Performed at [facility], [address], [place], [place] [address]    Opiates 09/06/2024 NONE DETECTED  NONE DETECTED Final   Cocaine 09/06/2024 NONE DETECTED  NONE DETECTED Final   Benzodiazepines 09/06/2024 POSITIVE (A)  NONE DETECTED Final   Amphetamines 09/06/2024 NONE DETECTED  NONE DETECTED Final   Tetrahydrocannabinol 09/06/2024 NONE DETECTED  NONE DETECTED Final   Barbiturates 09/06/2024 NONE DETECTED  NONE DETECTED Final   Comment: (NOTE) DRUG SCREEN FOR MEDICAL PURPOSES ONLY.  IF CONFIRMATION IS NEEDED FOR ANY PURPOSE, NOTIFY LAB WITHIN 5 DAYS.  LOWEST DETECTABLE  LIMITS FOR URINE DRUG SCREEN Drug Class                     Cutoff (ng/mL) Amphetamine and metabolites    1000 Barbiturate and metabolites    200 Benzodiazepine                 200 Opiates and metabolites        300 Cocaine and metabolites        300 THC                            50 Performed at [facility], [address], [place], [place] [address]   Admission on 09/04/2024, Discharged on 09/04/2024  Component Date Value Ref Range Status   Sodium 09/04/2024 144  135 - 145 mmol/L Final   Potassium 09/04/2024 4.0  3.5 - 5.1 mmol/L Final   Chloride 09/04/2024 110  98 - 111 mmol/L Final   CO2 09/04/2024 28  22 - 32 mmol/L Final   Glucose, Bld 09/04/2024 107 (H)  70 - 99 mg/dL Final   Glucose reference range applies only to samples taken after fasting for at least 8 hours.   BUN 09/04/2024 14  6 - 20  mg/dL Final   Creatinine, Ser 09/04/2024 0.84  0.44 - 1.00 mg/dL Final   Calcium  09/04/2024 10.2  8.9 - 10.3 mg/dL Final   Total Protein 88/92/7974 6.0 (L)  6.5 - 8.1 g/dL Final   Albumin 88/92/7974 3.8  3.5 - 5.0 g/dL Final   AST 88/92/7974 23  15 - 41 U/L Final   ALT 09/04/2024 17  0 - 44 U/L Final   Alkaline Phosphatase 09/04/2024 65  38 - 126 U/L Final   Total Bilirubin 09/04/2024 0.3  0.0 - 1.2 mg/dL Final   GFR, Estimated 09/04/2024 >60  >60 mL/min Final   Comment: (NOTE) Calculated using the CKD-EPI Creatinine Equation (2021)    Anion gap 09/04/2024 6  5 - 15 Final   Performed at [facility], [address], [place], [place] [address]   Alcohol, Ethyl (B) 09/04/2024 <15  <15 mg/dL Final   Comment: (NOTE) For medical purposes only. Performed at [facility], [address], [place], [place] [address]    WBC 09/04/2024 5.1  4.0 - 10.5 K/uL Final   RBC 09/04/2024 3.90  3.87 - 5.11 MIL/uL Final   Hemoglobin 09/04/2024 12.3  12.0 - 15.0 g/dL Final   HCT 88/92/7974 39.3  36.0 - 46.0 % Final   MCV 09/04/2024 100.8 (H)  80.0 - 100.0  fL Final   MCH 09/04/2024 31.5  26.0 - 34.0 pg Final   MCHC 09/04/2024 31.3  30.0 - 36.0 g/dL Final   RDW 88/92/7974 12.3  11.5 - 15.5 % Final   Platelets 09/04/2024 195  150 - 400 K/uL Final   nRBC 09/04/2024 0.0  0.0 - 0.2 % Final   Performed at [facility], [address] [place], [place] [address]   Opiates 09/04/2024 NEGATIVE  NEGATIVE Final   Cocaine 09/04/2024 NEGATIVE  NEGATIVE Final   Benzodiazepines 09/04/2024 POSITIVE (A)  NEGATIVE Final   Amphetamines 09/04/2024 NEGATIVE  NEGATIVE Final   Tetrahydrocannabinol 09/04/2024 POSITIVE (A)  NEGATIVE Final   Barbiturates 09/04/2024 NEGATIVE  NEGATIVE Final   Methadone Scn, Ur 09/04/2024 NEGATIVE  NEGATIVE Final   Fentanyl 09/04/2024 NEGATIVE  NEGATIVE Final   Comment: (NOTE) Drug screen is for Medical Purposes only. Positive results are preliminary only. If confirmation is needed, notify lab within 5 days.  Drug Class                 Cutoff (ng/mL) Amphetamine and metabolites 1000 Barbiturate and metabolites 200 Benzodiazepine              200 Opiates and metabolites     300 Cocaine and metabolites     300 THC                         50 Fentanyl                    5 Methadone                   300  Trazodone  is metabolized in vivo to several metabolites,  including pharmacologically active m-CPP, which is excreted in the  urine.  Immunoassay screens for amphetamines and MDMA have potential  cross-reactivity with these compounds and may provide false positive  result.  Performed at [facility], [address], [place], [place] [address]     Blood Alcohol level:  Lab Results  Component Value Date   [facility] <15 09/05/2024  ETH <15 09/04/2024    Metabolic Disorder Labs: Lab Results  Component Value Date   HGBA1C 4.7 (L) 09/08/2024   MPG 88.19 09/08/2024   No results found for: PROLACTIN Lab Results  Component Value Date   CHOL 148 09/08/2024   TRIG 69 09/08/2024   HDL  59 09/08/2024   CHOLHDL 2.5 09/08/2024   VLDL 14 09/08/2024   LDLCALC 75 09/08/2024    Therapeutic Lab Levels: No results found for: LITHIUM No results found for: VALPROATE No results found for: CBMZ  Physical Findings   AIMS    Flowsheet Row [facility]  AIMS Total Score 0   AUDIT    Flowsheet Row ED from 09/06/2024 in [facility] Admission (Discharged[facility]  Alcohol Use Disorder Identification Test Final Score (AUDIT) 25 22   PHQ2-9    Flowsheet Row ED from 09/06/2024 in [facility]  PHQ-2 Total Score 0   Flowsheet Row ED from 09/06/2024 in [facility] ED from 09/05/2024 in [facility] Department at [facility] ED from 09/04/2024 in [facility] Department at [facility]  C-SSRS RISK CATEGORY Low Risk High Risk Low Risk     Musculoskeletal  Strength & Muscle Tone: within normal limits Gait & Station: normal Patient leans: N/A  Psychiatric Specialty Exam  Presentation  General Appearance:  Appropriate for Environment; Fairly Groomed  Eye Contact: Fair  Speech: Clear and Coherent  Speech Volume: Normal  Handedness: Right   Mood and Affect  Mood: Anxious  Affect: Congruent; Depressed   Thought Process  Thought Processes: Coherent  Descriptions of Associations:Intact  Orientation:Full (Time, Place and Person)  Thought Content:Logical  Diagnosis of Schizophrenia or Schizoaffective disorder in past: No data recorded   Hallucinations:Hallucinations: None  Ideas of Reference:None  Suicidal Thoughts:Suicidal Thoughts: No  Homicidal Thoughts:Homicidal Thoughts: No   Sensorium  Memory: Immediate Fair  Judgment: Fair  Insight: Fair   Art Therapist   Concentration: Fair  Attention Span: Fair  Recall: Fair  Fund of Knowledge: Fair  Language: Fair   Psychomotor Activity  Psychomotor Activity: Psychomotor Activity: Normal   Assets  Assets: Resilience   Sleep  Sleep: Sleep: Fair  Estimated Sleeping Duration (Last 24 Hours): 12.75-13.75 hours (Due to Daylight Saving Time, the durations displayed may not accurately represent documentation during the time change interval)  Nutritional Assessment (For OBS and FBC admissions only) Has the patient had a weight loss or gain of 10 pounds or more in the last 3 months?: No Has the patient had a decrease in food intake/or appetite?: No Does the patient have dental problems?: No Does the patient have eating habits or behaviors that may be indicators of an eating disorder including binging or inducing vomiting?: No Has the patient recently lost weight without trying?: 0 Has the patient been eating poorly because of a decreased appetite?: 0 Malnutrition Screening Tool Score: 0    Physical Exam  Physical Exam Vitals and nursing note reviewed.  Constitutional:      Appearance: Normal appearance.  Musculoskeletal:     Cervical back: Normal range of motion.  Neurological:     General: No focal deficit present.     Mental Status: She is alert and oriented to person, place, and time.    Review of Systems  Psychiatric/Behavioral:  Positive for depression and substance abuse. Negative for  hallucinations, memory loss and suicidal ideas. The patient is nervous/anxious and has insomnia.   All other systems reviewed and are negative.  Blood pressure 120/87, pulse 80, temperature (!) 97.5 F (36.4 C), temperature source Oral, resp. rate 18, SpO2 93%. There is no height or weight on file to calculate BMI.  Treatment Plan Summary: Daily contact with patient to assess and evaluate symptoms and progress in treatment and Medication management  Treatment Plan Summary: Daily  contact with patient to assess and evaluate symptoms and progress in treatment and Medication management   Safety and Monitoring: Involuntary admission to inpatient psychiatric unit for safety, stabilization and treatment Daily contact with patient to assess and evaluate symptoms and progress in treatment Patient's case to be discussed in multi-disciplinary team meeting Observation Level : q15 minute checks Vital signs: q12 hours Precautions: Safety   Long Term Goal(s): Improvement in symptoms so as ready for discharge   Short Term Goals: Ability to identify changes in lifestyle to reduce recurrence of condition will improve, Ability to verbalize feelings will improve, Ability to disclose and discuss suicidal ideas, Ability to demonstrate self-control will improve, Ability to identify and develop effective coping behaviors will improve, Ability to maintain clinical measurements within normal limits will improve, Compliance with prescribed medications will improve, and Ability to identify triggers associated with substance abuse/mental health issues will improve   Diagnoses Principal Problem:   Alcohol-induced mood disorder (HCC)   Medications: -Continue Ativan  taper for alcohol use disorder-see the MAR for details -Continue clonidine  0.1 mg twice daily for systolic blood pressure over 160 or diastolic blood pressure over 100 - Continue hydroxyzine  25 mg 3 times daily as needed for anxiety - continue Norvasc  5 mg daily-Home medication - continue benazepril 40 mg daily-Home medication -Continue trazodone  50 mg nightly as needed for sleep -Continue Tylenol  650 mg every 6 hours PRN for mild pain -Continue Maalox 30 mg every 4 hrs PRN for indigestion -Continue Milk of Magnesia as needed every 6 hrs for constipation   Discharge Planning: Social work and case management to assist with discharge planning and identification of hospital follow-up needs prior to discharge Estimated LOS: 5-7  days Discharge Concerns: Need to establish a safety plan; Medication compliance and effectiveness Discharge Goals: Return home with outpatient referrals for mental health follow-up including medication management/psychotherapy   I certify that inpatient services furnished can reasonably be expected to improve the patient's condition.     Donia Snell, NP 09/08/2024 6:39 PM

## 2024-09-08 NOTE — Group Note (Signed)
 Group Topic: Recovery Basics  Group Date: 09/08/2024 Start Time: 1205 End Time: 1240 Facilitators: Stanly Stabile, RN  Department: [facility]  Number of Participants: 8  Group Focus: chemical dependency education and chemical dependency issues Treatment Modality:  Psychoeducation Interventions utilized were clarification, exploration, and patient education Purpose: express feelings, improve communication skills, increase insight, regain self-worth, reinforce self-care, and relapse prevention strategies   Name: Victoria Patel Date of Birth: 1966/09/18  MR: [id number]    Level of Participation: moderate Quality of Participation: cooperative Interactions with others: gave feedback Mood/Affect: appropriate Triggers (if applicable):   Cognition: coherent/clear Progress: Gaining insight Response:   Plan: follow-up needed  Patients Problems:  Patient Active Problem List   Diagnosis Date Noted   Severe episode of recurrent major depressive disorder, without psychotic features (HCC) 09/06/2024   Alcohol-induced mood disorder (HCC) 09/06/2024   Schizoaffective disorder (HCC) 04/10/2021   Ingestion of substance    Hypomagnesemia    Hypothyroidism 04/09/2021   QT prolongation 04/09/2021   Drug overdose 04/08/2021   Essential hypertension 04/08/2021   Suicidal ideation 04/08/2021   Hypokalemia 04/08/2021   Alcohol abuse 04/08/2021   Major depressive disorder, single episode, severe (HCC) 04/08/2021   Alcohol use disorder, severe, dependence (HCC) 04/08/2021   Abnormal EKG    Alcoholic intoxication without complication

## 2024-09-08 NOTE — Group Note (Signed)
 Group Topic: Social Support  Group Date: 09/08/2024 Start Time: 2000 End Time: 2030 Facilitators: Joan Plowman B  Department: [facility]  Number of Participants: 9  Group Focus: check in and social skills Treatment Modality:  Individual Therapy and Patient-Centered Therapy Interventions utilized were support Purpose: express feelings  Name: Victoria Patel Date of Birth: [birth year]  MR: [id number]    Level of Participation: active Quality of Participation: attentive and cooperative Interactions with others: gave feedback Mood/Affect: appropriate Triggers (if applicable): NA Cognition: coherent/clear Progress: Gaining insight Response: NA Plan: patient will be encouraged to keep going to groups  Patients Problems:  Patient Active Problem List   Diagnosis Date Noted   Severe episode of recurrent major depressive disorder, without psychotic features (HCC) 09/06/2024   Alcohol-induced mood disorder (HCC) 09/06/2024   Schizoaffective disorder (HCC) 04/10/2021   Ingestion of substance    Hypomagnesemia    Hypothyroidism 04/09/2021   QT prolongation 04/09/2021   Drug overdose 04/08/2021   Essential hypertension 04/08/2021   Suicidal ideation 04/08/2021   Hypokalemia 04/08/2021   Alcohol abuse 04/08/2021   Major depressive disorder, single episode, severe (HCC) 04/08/2021   Alcohol use disorder, severe, dependence (HCC) 04/08/2021   Abnormal EKG    Alcoholic intoxication without complication

## 2024-09-08 NOTE — Care Management (Addendum)
 [facility] Care Management...   Addendum 3:03 pm  Per [place] @ [facility]..  Patient has been accepted into their program  [place] will reach out to provider to arrange/confirm transportation  Addendum 1:08 pm  Writer assisted patient in completing phone assessment with [facility]. [facility] to verify insurance and return call to Banker met with patient to discuss discharge planning...  Patient reported unsure of next steps. Patient asked writer to contact husband [phone] to discuss discharge plan.    Writer contacted husband and feels that patient will do good at [facility] and re-connecting with her therapist and attending AA meetings.   Husband stated he would be comfortable with any decision that the patient makes. Husband reported that patient that does not have to worry or have concern about her job. Employer very supportive.  Writer will follow up

## 2024-09-09 DIAGNOSIS — F411 Generalized anxiety disorder: Secondary | ICD-10-CM | POA: Diagnosis not present

## 2024-09-09 DIAGNOSIS — F1094 Alcohol use, unspecified with alcohol-induced mood disorder: Secondary | ICD-10-CM | POA: Diagnosis not present

## 2024-09-09 NOTE — ED Notes (Signed)
 Patient remains on unit with no acute changes noted at this time. Alert and oriented x3. Interacting with peers/staff appropriately. No current suicidal or homicidal ideation reported. No hallucinations or delusions observed.Patient is tolerating the milieu. Vitals within normal limits. Medications administered as ordered; no adverse effects noted. Safety maintained

## 2024-09-09 NOTE — Discharge Instructions (Addendum)
 [facility] Care Management...  Base on the information you have provided and the presenting issue, outpatient services and resources have been recommended.  It is imperative that you follow through with treatment recommendations within 5-7 days from the of discharge to mitigate further risk to your safety and mental well-being. A list of referrals has been provided below to get you started.  You are not limited to the list provided.  In case of an urgent crisis, you may contact the Mobile Crisis Unit with [facility] at [phone].   Appointment 09/11/2024 @ 9:00 am... [facility] [address],  [place], [place] [address] Phone: [phone]  Discharge recommendations:   Medications: Patient is to take medications as prescribed. The patient or patient's guardian is to contact a medical professional and/or outpatient provider to address any new side effects that develop. The patient or the patient's guardian should update outpatient providers of any new medications and/or medication changes.    Outpatient Follow up: Please review list of outpatient resources for psychiatry and counseling. Please follow up with your primary care provider for all medical related needs. Follow up with PCP to discuss hypothyroidism due to elevated TSH level of  6.723   Therapy: We recommend that patient participate in individual therapy to address mental health concerns.   Safety:   The following safety precautions should be taken:   No sharp objects. This includes scissors, razors, scrapers, and putty knives.   Chemicals should be removed and locked up.   Medications should be removed and locked up.   Weapons should be removed and locked up. This includes firearms, knives and instruments that can be used to cause injury.   The patient should abstain from use of illicit substances/drugs and abuse of any medications.  If symptoms worsen or do not continue to improve or if the  patient becomes actively suicidal or homicidal then it is recommended that the patient return to the closest hospital emergency department, the [facility], or call 911 for further evaluation and treatment. National Suicide Prevention Lifeline 1-800-SUICIDE or 352-416-7969.  About 988 988 offers 24/7 access to trained crisis counselors who can help people experiencing mental health-related distress. People can call or text 988 or chat 988lifeline.org for themselves or if they are worried about a loved one who may need crisis support.

## 2024-09-09 NOTE — ED Notes (Signed)
 Pt in the dayroom watching TV & interacting with peers. No acute distress noted. Respirations even and unlabored. Will continue to monitor for safety.

## 2024-09-09 NOTE — ED Provider Notes (Signed)
 Behavioral Health Progress Note  Date and Time: 09/09/2024 4:59 PM Name: Victoria Patel MRN:  [id number]  Victoria Patel is a 58 year old female who presented to [facility] behavioral health facility based crisis unit from [facility] emergency department on 09/06/2024.  IVC completed by previous residential substance use treatment facility, [facility].   petition initiated related to hallucinations and altered mental status.  Patient also verbalized thoughts of suicide while at [facility], multiple plans verbalized.  Patient is reassessed by this nurse practitioner face-to-face.  She is seated in common area at facility based crisis unit upon my approach.  Patient is eating  and interacting with peers.  She presents with euthymic mood, congruent affect.  She is alert and oriented, pleasant and appropriate.  Nayeliz has considered residential substance use treatment options but prefers to discharge home.  She would like to follow-up at the [facility] center on an outpatient basis.  Patient states I need to get back to work.  Patient endorses history of alcohol use disorder.  Last drink 7 days ago.  Noheli denies suicidal and homicidal ideation.  She contracts verbally for safety at this time including she denies auditory and visual hallucination.  There is no evidence of delusional thought content no indication that patient is responding to internal stimuli.    Subjective: I want to follow-up with outpatient so that I can get back home and get my schedule together.  I want to be able to go to work and continue to attend meetings and groups.  Diagnosis:  Final diagnoses:  Alcohol-induced mood disorder with depressive symptoms (HCC)  GAD (generalized anxiety disorder)    Total Time spent with patient: 20 minutes  Past Psychiatric History: Anxiety, major depressive disorder, recurrent, severe, alcohol use disorder, alcohol-induced mood disorder, suicidal ideation Past Medical  History: GERD, hypertension, hypothyroidism, hypokalemia, QT prolongation, hypomagnesemia Family History: None reported Family Psychiatric  History: None reported Social History: Resides with husband, employed, denies access to weapons, alcohol use disorder, early remission Additional Social History:                         Sleep: Fair  Appetite:  Good  Current Medications:  Current Facility-Administered Medications  Medication Dose Route Frequency Provider Last Rate Last Admin   acetaminophen  (TYLENOL ) tablet 650 mg  650 mg Oral Q6H PRN Nkwenti, Doris, NP       alum & mag hydroxide-simeth (MAALOX/MYLANTA) 200-200-20 MG/5ML suspension 30 mL  30 mL Oral Q4H PRN Nkwenti, Doris, NP       benazepril (LOTENSIN) tablet 40 mg  40 mg Oral Daily Nkwenti, Doris, NP   40 mg at 09/09/24 9060   And   amLODipine  (NORVASC ) tablet 5 mg  5 mg Oral Daily Tex Drilling, NP   5 mg at 09/09/24 9060   cloNIDine  (CATAPRES ) tablet 0.1 mg  0.1 mg Oral BID PRN Tex Drilling, NP       hydrOXYzine  (ATARAX ) tablet 25 mg  25 mg Oral TID PRN Tex Drilling, NP       [START ON 09/10/2024] LORazepam  (ATIVAN ) tablet 1 mg  1 mg Oral Daily Nkwenti, Doris, NP       magnesium  hydroxide (MILK OF MAGNESIA) suspension 30 mL  30 mL Oral Daily PRN Tex Drilling, NP   30 mL at 09/08/24 0940   multivitamin with minerals tablet 1 tablet  1 tablet Oral Daily Tex Drilling, NP   1 tablet at 09/09/24 0939   nicotine (  NICODERM CQ - dosed in mg/24 hours) patch 21 mg  21 mg Transdermal Q0600 Nkwenti, Donia, NP       OLANZapine (ZYPREXA) injection 5 mg  5 mg Intramuscular TID PRN Tex Donia, NP       OLANZapine zydis (ZYPREXA) disintegrating tablet 5 mg  5 mg Oral TID PRN Tex Donia, NP       senna-docusate (Senokot-S) tablet 1 tablet  1 tablet Oral BID Tex Donia, NP   1 tablet at 09/09/24 9060   thiamine  (VITAMIN B1) tablet 100 mg  100 mg Oral Daily Tex Donia, NP   100 mg at 09/09/24 9061   traZODone   (DESYREL ) tablet 50 mg  50 mg Oral QHS PRN Tex Donia, NP   50 mg at 09/08/24 2138   Current Outpatient Medications  Medication Sig Dispense Refill   amLODipine -benazepril (LOTREL) 5-40 MG capsule Take 1 capsule by mouth daily.     atenolol  (TENORMIN ) 50 MG tablet Take 50 mg by mouth daily.     carbamazepine (TEGRETOL) 200 MG tablet Take by mouth. TAKE ONE TABLET TWICE DAILY FOR 3 DAYS AT 8AM AND 9PM THEN 1/2 TABLET TWICE DAILY FOR 3 DAYS THEN 1/2 TABLET DAILY FOR 3 DAYS     chlordiazePOXIDE  (LIBRIUM ) 25 MG capsule 50mg  PO TID x 1D, then 25-50mg  PO BID X 1D, then 25-50mg  PO QD X 1D (Patient not taking: Reported on 09/05/2024) 12 capsule 0   escitalopram  (LEXAPRO ) 20 MG tablet Take 20 mg by mouth every morning.     esomeprazole (NEXIUM) 20 MG capsule Take 20 mg by mouth every morning.     Multiple Vitamins-Minerals (MULTIVITAMIN ADULT, MINERALS,) TABS TAKE ONE TABLET DAILY AT 8AM     ondansetron  (ZOFRAN -ODT) 8 MG disintegrating tablet Take 1 tablet (8 mg total) by mouth every 8 (eight) hours as needed for nausea or vomiting. 12 tablet 0   OVER THE COUNTER MEDICATION Place 1 drop into both eyes daily as needed (dry eyes). Dry eye drop     rosuvastatin  (CRESTOR ) 40 MG tablet Take 40 mg by mouth daily.     thiamine  100 MG tablet Take 1 tablet (100 mg total) by mouth daily.     traZODone  (DESYREL ) 100 MG tablet Take 100 mg by mouth at bedtime.      Labs  Lab Results:  Admission on 09/06/2024  Component Date Value Ref Range Status   POC Amphetamine UR 09/06/2024 None Detected  NONE DETECTED (Cut Off Level 1000 ng/mL) Final   POC Secobarbital (BAR) 09/06/2024 None Detected  NONE DETECTED (Cut Off Level 300 ng/mL) Final   POC Buprenorphine (BUP) 09/06/2024 None Detected  NONE DETECTED (Cut Off Level 10 ng/mL) Final   POC Oxazepam (BZO) 09/06/2024 Positive (A)  NONE DETECTED (Cut Off Level 300 ng/mL) Final   POC Cocaine UR 09/06/2024 None Detected  NONE DETECTED (Cut Off Level 300 ng/mL) Final    POC Methamphetamine UR 09/06/2024 None Detected  NONE DETECTED (Cut Off Level 1000 ng/mL) Final   POC Morphine  09/06/2024 None Detected  NONE DETECTED (Cut Off Level 300 ng/mL) Final   POC Methadone UR 09/06/2024 None Detected  NONE DETECTED (Cut Off Level 300 ng/mL) Final   POC Oxycodone  UR 09/06/2024 None Detected  NONE DETECTED (Cut Off Level 100 ng/mL) Final   POC Marijuana UR 09/06/2024 Positive (A)  NONE DETECTED (Cut Off Level 50 ng/mL) Final   Cholesterol 09/08/2024 148  0 - 200 mg/dL Final   Triglycerides 88/88/7974 69  <150 mg/dL Final  HDL 09/08/2024 59  >40 mg/dL Final   Total CHOL/HDL Ratio 09/08/2024 2.5  RATIO Final   VLDL 09/08/2024 14  0 - 40 mg/dL Final   LDL Cholesterol 09/08/2024 75  0 - 99 mg/dL Final   Comment:        Total Cholesterol/HDL:CHD Risk Coronary Heart Disease Risk Table                     Men   Women  1/2 Average Risk   3.4   3.3  Average Risk       5.0   4.4  2 X Average Risk   9.6   7.1  3 X Average Risk  23.4   11.0        Use the calculated Patient Ratio above and the CHD Risk Table to determine the patient's CHD Risk.        ATP III CLASSIFICATION (LDL):  <100     mg/dL   Optimal  899-870  mg/dL   Near or Above                    Optimal  130-159  mg/dL   Borderline  839-810  mg/dL   High  >809     mg/dL   Very High Performed at [facility], [address] [place], [place] [address]    Hgb A1c MFr Bld 09/08/2024 4.7 (L)  4.8 - 5.6 % Final   Comment: (NOTE) Diagnosis of Diabetes The following HbA1c ranges recommended by the American Diabetes Association (ADA) may be used as an aid in the diagnosis of diabetes mellitus.  Hemoglobin             Suggested A1C NGSP%              Diagnosis  <5.7                   Non Diabetic  5.7-6.4                Pre-Diabetic  >6.4                   Diabetic  <7.0                   Glycemic control for                       adults with diabetes.     Mean Plasma Glucose 09/08/2024 88.19   mg/dL Final   Performed at [facility], [address], [place], [place] [address]   Vit D, 25-Hydroxy 09/08/2024 49.72  30 - 100 ng/mL Final   Comment: (NOTE) Vitamin D deficiency has been defined by the Institute of Medicine  and an Endocrine Society practice guideline as a level of serum 25-OH  vitamin D less than 20 ng/mL (1,2). The Endocrine Society went on to  further define vitamin D insufficiency as a level between 21 and 29  ng/mL (2).  1. IOM (Institute of Medicine). 2010. Dietary reference intakes for  calcium  and D. [place]: [employer]. 2. Holick MF, Binkley Marion, Bischoff-Ferrari HA, et al. Evaluation,  treatment, and prevention of vitamin D deficiency: an Endocrine  Society clinical practice guideline, JCEM. 2011 Jul; 96(7): 1911-30.  Performed at [facility], [address] [place], [place] [address]    Vitamin B-12 09/08/2024 234  180 - 914 pg/mL  Final   Comment: (NOTE) This assay is not validated for testing neonatal or myeloproliferative syndrome specimens for Vitamin B12 levels. Performed at [facility], [address], [place], [place] [address]    TSH 09/08/2024 6.723 (H)  0.350 - 4.500 uIU/mL Final   Comment: Performed by a 3rd Generation assay with a functional sensitivity of <=0.01 uIU/mL. Performed at [facility], [address] [place], [place] [address]   Admission on 09/05/2024, Discharged on 09/06/2024  Component Date Value Ref Range Status   WBC 09/05/2024 5.7  4.0 - 10.5 K/uL Final   RBC 09/05/2024 3.72 (L)  3.87 - 5.11 MIL/uL Final   Hemoglobin 09/05/2024 12.3  12.0 - 15.0 g/dL Final   HCT 88/91/7974 37.3  36.0 - 46.0 % Final   MCV 09/05/2024 100.3 (H)  80.0 - 100.0 fL Final   MCH 09/05/2024 33.1  26.0 - 34.0 pg Final   MCHC 09/05/2024 33.0  30.0 - 36.0 g/dL Final   RDW 88/91/7974 12.1  11.5 - 15.5 % Final   Platelets 09/05/2024 175  150 - 400 K/uL Final   nRBC 09/05/2024 0.0  0.0 - 0.2 %  Final   Neutrophils Relative % 09/05/2024 54  % Final   Neutro Abs 09/05/2024 3.1  1.7 - 7.7 K/uL Final   Lymphocytes Relative 09/05/2024 32  % Final   Lymphs Abs 09/05/2024 1.8  0.7 - 4.0 K/uL Final   Monocytes Relative 09/05/2024 10  % Final   Monocytes Absolute 09/05/2024 0.6  0.1 - 1.0 K/uL Final   Eosinophils Relative 09/05/2024 3  % Final   Eosinophils Absolute 09/05/2024 0.2  0.0 - 0.5 K/uL Final   Basophils Relative 09/05/2024 1  % Final   Basophils Absolute 09/05/2024 0.0  0.0 - 0.1 K/uL Final   Immature Granulocytes 09/05/2024 0  % Final   Abs Immature Granulocytes 09/05/2024 0.01  0.00 - 0.07 K/uL Final   Performed at [facility], [address], [place], [place] [address]   Sodium 09/05/2024 138  135 - 145 mmol/L Final   Potassium 09/05/2024 3.6  3.5 - 5.1 mmol/L Final   Chloride 09/05/2024 102  98 - 111 mmol/L Final   CO2 09/05/2024 28  22 - 32 mmol/L Final   Glucose, Bld 09/05/2024 95  70 - 99 mg/dL Final   Glucose reference range applies only to samples taken after fasting for at least 8 hours.   BUN 09/05/2024 8  6 - 20 mg/dL Final   Creatinine, Ser 09/05/2024 0.76  0.44 - 1.00 mg/dL Final   Calcium  09/05/2024 9.7  8.9 - 10.3 mg/dL Final   Total Protein 88/91/7974 5.5 (L)  6.5 - 8.1 g/dL Final   Albumin 88/91/7974 3.1 (L)  3.5 - 5.0 g/dL Final   AST 88/91/7974 17  15 - 41 U/L Final   ALT 09/05/2024 15  0 - 44 U/L Final   Alkaline Phosphatase 09/05/2024 41  38 - 126 U/L Final   Total Bilirubin 09/05/2024 0.8  0.0 - 1.2 mg/dL Final   GFR, Estimated 09/05/2024 >60  >60 mL/min Final   Comment: (NOTE) Calculated using the CKD-EPI Creatinine Equation (2021)    Anion gap 09/05/2024 8  5 - 15 Final   Performed at [facility], [address] [place], [place] [address]   Alcohol, Ethyl (B) 09/05/2024 <15  <15 mg/dL Final   Comment: (NOTE) For medical purposes only. Performed at [facility], [address] [place], [place] [address]    Salicylate  Lvl 09/05/2024 <7.0 (L)  7.0 - 30.0 mg/dL Final   Performed at [facility], [address], [place], [place] [address]   Acetaminophen  (Tylenol ), Serum 09/05/2024 <10 (L)  10 - 30 ug/mL Final   Comment: (NOTE) Therapeutic concentrations vary significantly. A range of 10-30 ug/mL  may be an effective concentration for many patients. However, some  are best treated at concentrations outside of this range. Acetaminophen  concentrations >150 ug/mL at 4 hours after ingestion  and >50 ug/mL at 12 hours after ingestion are often associated with  toxic reactions.  Performed at [facility], [address], [place], [place] [address]    Opiates 09/06/2024 NONE DETECTED  NONE DETECTED Final   Cocaine 09/06/2024 NONE DETECTED  NONE DETECTED Final   Benzodiazepines 09/06/2024 POSITIVE (A)  NONE DETECTED Final   Amphetamines 09/06/2024 NONE DETECTED  NONE DETECTED Final   Tetrahydrocannabinol 09/06/2024 NONE DETECTED  NONE DETECTED Final   Barbiturates 09/06/2024 NONE DETECTED  NONE DETECTED Final   Comment: (NOTE) DRUG SCREEN FOR MEDICAL PURPOSES ONLY.  IF CONFIRMATION IS NEEDED FOR ANY PURPOSE, NOTIFY LAB WITHIN 5 DAYS.  LOWEST DETECTABLE LIMITS FOR URINE DRUG SCREEN Drug Class                     Cutoff (ng/mL) Amphetamine and metabolites    1000 Barbiturate and metabolites    200 Benzodiazepine                 200 Opiates and metabolites        300 Cocaine and metabolites        300 THC                            50 Performed at [facility], [address] [place], [place] [address]   Admission on 09/04/2024, Discharged on 09/04/2024  Component Date Value Ref Range Status   Sodium 09/04/2024 144  135 - 145 mmol/L Final   Potassium 09/04/2024 4.0  3.5 - 5.1 mmol/L Final   Chloride 09/04/2024 110  98 - 111 mmol/L Final   CO2 09/04/2024 28  22 - 32 mmol/L Final   Glucose, Bld 09/04/2024 107 (H)  70 - 99 mg/dL Final   Glucose reference range applies only to samples  taken after fasting for at least 8 hours.   BUN 09/04/2024 14  6 - 20 mg/dL Final   Creatinine, Ser 09/04/2024 0.84  0.44 - 1.00 mg/dL Final   Calcium  09/04/2024 10.2  8.9 - 10.3 mg/dL Final   Total Protein 88/92/7974 6.0 (L)  6.5 - 8.1 g/dL Final   Albumin 88/92/7974 3.8  3.5 - 5.0 g/dL Final   AST 88/92/7974 23  15 - 41 U/L Final   ALT 09/04/2024 17  0 - 44 U/L Final   Alkaline Phosphatase 09/04/2024 65  38 - 126 U/L Final   Total Bilirubin 09/04/2024 0.3  0.0 - 1.2 mg/dL Final   GFR, Estimated 09/04/2024 >60  >60 mL/min Final   Comment: (NOTE) Calculated using the CKD-EPI Creatinine Equation (2021)    Anion gap 09/04/2024 6  5 - 15 Final   Performed at [facility], [address] [place], [place] [address]   Alcohol, Ethyl (B) 09/04/2024 <15  <15 mg/dL Final   Comment: (NOTE) For medical purposes only. Performed at [facility], [address], [place], [place] [address]    WBC 09/04/2024 5.1  4.0 - 10.5 K/uL Final   RBC 09/04/2024 3.90  3.87 - 5.11 MIL/uL Final   Hemoglobin 09/04/2024 12.3  12.0 - 15.0 g/dL Final   HCT 88/92/7974 39.3  36.0 - 46.0 % Final   MCV 09/04/2024 100.8 (H)  80.0 - 100.0 fL Final   MCH 09/04/2024 31.5  26.0 - 34.0 pg Final   MCHC 09/04/2024 31.3  30.0 - 36.0 g/dL Final   RDW 88/92/7974 12.3  11.5 - 15.5 % Final   Platelets 09/04/2024 195  150 - 400 K/uL Final   nRBC 09/04/2024 0.0  0.0 - 0.2 % Final   Performed at [facility], [address], [place], [place] [address]   Opiates 09/04/2024 NEGATIVE  NEGATIVE Final   Cocaine 09/04/2024 NEGATIVE  NEGATIVE Final   Benzodiazepines 09/04/2024 POSITIVE (A)  NEGATIVE Final   Amphetamines 09/04/2024 NEGATIVE  NEGATIVE Final   Tetrahydrocannabinol 09/04/2024 POSITIVE (A)  NEGATIVE Final   Barbiturates 09/04/2024 NEGATIVE  NEGATIVE Final   Methadone Scn, Ur 09/04/2024 NEGATIVE  NEGATIVE Final   Fentanyl 09/04/2024 NEGATIVE  NEGATIVE Final    Comment: (NOTE) Drug screen is for Medical Purposes only. Positive results are preliminary only. If confirmation is needed, notify lab within 5 days.  Drug Class                 Cutoff (ng/mL) Amphetamine and metabolites 1000 Barbiturate and metabolites 200 Benzodiazepine              200 Opiates and metabolites     300 Cocaine and metabolites     300 THC                         50 Fentanyl                    5 Methadone                   300  Trazodone  is metabolized in vivo to several metabolites,  including pharmacologically active m-CPP, which is excreted in the  urine.  Immunoassay screens for amphetamines and MDMA have potential  cross-reactivity with these compounds and may provide false positive  result.  Performed at [facility], [address] [place], [place] [address]     Blood Alcohol level:  Lab Results  Component Value Date   [facility] <15 09/05/2024   ETH <15 09/04/2024    Metabolic Disorder Labs: Lab Results  Component Value Date   HGBA1C 4.7 (L) 09/08/2024   MPG 88.19 09/08/2024   No results found for: PROLACTIN Lab Results  Component Value Date   CHOL 148 09/08/2024   TRIG 69 09/08/2024   HDL 59 09/08/2024   CHOLHDL 2.5 09/08/2024   VLDL 14 09/08/2024   LDLCALC 75 09/08/2024    Therapeutic Lab Levels: No results found for: LITHIUM No results found for: VALPROATE No results found for: CBMZ  Physical Findings   AIMS    Flowsheet Row [facility]  AIMS Total Score 0   AUDIT    Flowsheet Row ED from 09/06/2024 in [facility] Admission (Discharged[facility]  Alcohol Use Disorder Identification Test Final Score (AUDIT) 25 22   PHQ2-9    Flowsheet Row ED from 09/06/2024  in [facility]  PHQ-2 Total Score 2  PHQ-9 Total Score 3    Flowsheet Row ED from 09/06/2024 in [facility] ED from 09/05/2024 in [facility] Emergency Department at [facility] ED from 09/04/2024 in [facility] Emergency Department at [facility]  C-SSRS RISK CATEGORY Low Risk High Risk Low Risk     Musculoskeletal  Strength & Muscle Tone: within normal limits Gait & Station: normal Patient leans: N/A  Psychiatric Specialty Exam  Presentation  General Appearance:  Appropriate for Environment; Casual  Eye Contact: Good  Speech: Clear and Coherent; Normal Rate  Speech Volume: Normal  Handedness: Right   Mood and Affect  Mood: Euthymic  Affect: Appropriate; Congruent   Thought Process  Thought Processes: Coherent; Goal Directed; Linear  Descriptions of Associations:Intact  Orientation:Full (Time, Place and Person)  Thought Content:Logical; WDL  Diagnosis of Schizophrenia or Schizoaffective disorder in past: No data recorded   Hallucinations:Hallucinations: None  Ideas of Reference:None  Suicidal Thoughts:Suicidal Thoughts: No  Homicidal Thoughts:Homicidal Thoughts: No   Sensorium  Memory: Immediate Good; Recent Fair  Judgment: Good  Insight: Good   Executive Functions  Concentration: Good  Attention Span: Good  Recall: Good  Fund of Knowledge: Good  Language: Good   Psychomotor Activity  Psychomotor Activity: Psychomotor Activity: Normal   Assets  Assets: Communication Skills; Desire for Improvement; Financial Resources/Insurance; Housing; Physical Health; Social Support; Vocational/Educational; Resilience   Sleep  Sleep: Sleep: Fair  Estimated Sleeping Duration (Last 24 Hours): 5.25-5.75 hours (Due to Daylight Saving Time, the durations displayed may not accurately represent documentation during the time change interval)  Nutritional Assessment (For OBS and FBC admissions only) Has the patient had a weight loss or gain of 10 pounds  or more in the last 3 months?: No Has the patient had a decrease in food intake/or appetite?: No Does the patient have dental problems?: No Does the patient have eating habits or behaviors that may be indicators of an eating disorder including binging or inducing vomiting?: No Has the patient recently lost weight without trying?: 0 Has the patient been eating poorly because of a decreased appetite?: 0 Malnutrition Screening Tool Score: 0    Physical Exam  Physical Exam Vitals and nursing note reviewed.  Constitutional:      Appearance: Normal appearance. She is well-developed.  HENT:     Head: Normocephalic and atraumatic.     Nose: Nose normal.  Cardiovascular:     Rate and Rhythm: Normal rate.  Pulmonary:     Effort: Pulmonary effort is normal.  Musculoskeletal:        General: Normal range of motion.     Cervical back: Normal range of motion.  Skin:    General: Skin is warm and dry.  Neurological:     Mental Status: She is alert and oriented to person, place, and time.  Psychiatric:        Attention and Perception: Attention and perception normal.        Mood and Affect: Mood and affect normal.        Speech: Speech normal.        Behavior: Behavior normal. Behavior is cooperative.        Thought Content: Thought content normal.        Cognition and Memory: Cognition and memory normal.        Judgment: Judgment normal.    Review of Systems  Constitutional: Negative.   HENT: Negative.  Eyes: Negative.   Respiratory: Negative.    Cardiovascular: Negative.   Gastrointestinal: Negative.   Genitourinary: Negative.   Musculoskeletal: Negative.   Skin: Negative.   Neurological: Negative.   Psychiatric/Behavioral: Negative.     Blood pressure 139/86, pulse 74, temperature 98 F (36.7 C), resp. rate 17, SpO2 97%. There is no height or weight on file to calculate BMI.  Treatment Plan Summary: Daily contact with patient to assess and evaluate symptoms and progress in  treatment Involuntary commitment petition rescinded today.  Patient signed for voluntary admission. Current treatment plan includes likely discharge home on 09/10/2024 with close outpatient follow-up for substance use treatment and mental health needs at the [facility]. No medication changes today, patient denies complaints.  Current medications: -Acetaminophen  650 mg every 6 hours, as needed/mild pain -Benazepril 40 mg daily -Amlodipine  5 mg daily -Clonidine  0.1 mg twice daily as needed/systolic blood pressure greater than 160 or diastolic blood pressure greater than 100 -Maalox 30 mL oral every 4 hours, as needed/digestion -Hydroxyzine  25 mg 3 times daily as needed/anxiety -Magnesium  hydroxide 30 mL daily as needed/mild constipation -Senna docusate 1 tablet twice daily -Trazodone  50 mg nightly, as needed/sleep -NicoDerm 21 mg transdermal patch daily/nicotine withdrawal  CIWA Ativan  taper continues, see MAR for details: -Lorazepam  1 mg 4 times daily x4 doses, 1 mg 3 times daily x3 doses, 1 mg 2 times daily x2 doses, 1 mg daily x1 dose -Multivitamin with minerals 1 tablet daily -Thiamine  tablet 100 mg daily  Ellouise LITTIE Dawn, FNP 09/09/2024 4:59 PM

## 2024-09-09 NOTE — Group Note (Signed)
 Group Topic: Positive Affirmations  Group Date: 09/09/2024 Start Time: 1200 End Time: 1230 Facilitators: Reinhold Minor BROCKS, RN  Department: [facility]  Number of Participants: 9  Group Focus: acceptance Treatment Modality:  Psychoeducation Interventions utilized were group exercise Purpose: regain self-worth  Name: Victoria Patel Date of Birth: February 01, 1966  MR: [id number]    Level of Participation: active Quality of Participation: cooperative and offered feedback Interactions with others: gave feedback Mood/Affect: positive Triggers (if applicable): na Cognition: goal directed Progress: Moderate Response: pt spoke positively about self Plan: patient will be encouraged to keep speaking positively about self Patients Problems:  Patient Active Problem List   Diagnosis Date Noted   Severe episode of recurrent major depressive disorder, without psychotic features (HCC) 09/06/2024   Alcohol-induced mood disorder (HCC) 09/06/2024   Schizoaffective disorder (HCC) 04/10/2021   Ingestion of substance    Hypomagnesemia    Hypothyroidism 04/09/2021   QT prolongation 04/09/2021   Drug overdose 04/08/2021   Essential hypertension 04/08/2021   Suicidal ideation 04/08/2021   Hypokalemia 04/08/2021   Alcohol abuse 04/08/2021   Major depressive disorder, single episode, severe (HCC) 04/08/2021   Alcohol use disorder, severe, dependence (HCC) 04/08/2021   Abnormal EKG    Alcoholic intoxication without complication

## 2024-09-09 NOTE — ED Notes (Signed)
 Pt resting in bed, no acute distress noted. Respirations even and unlabored. Will continue to monitor for safety.

## 2024-09-09 NOTE — Group Note (Signed)
 Group Topic: Change and Accountability  Group Date: 09/09/2024 Start Time: 1900 End Time: 1920 Facilitators: Carollynn Genre, NT  Department: [facility]  Number of Participants: 7  Group Focus: acceptance Treatment Modality:  Skills Training Interventions utilized were support Purpose: enhance coping skills  Name: Victoria Patel Date of Birth: November 19, 1965  MR: [id number]    Level of Participation: active Quality of Participation: attentive Interactions with others: gave feedback Mood/Affect: appropriate Triggers (if applicable): N a Cognition: coherent/clear Progress: Gaining insight Response: Good Plan: patient will be encouraged to attend groups  Patients Problems:  Patient Active Problem List   Diagnosis Date Noted   Severe episode of recurrent major depressive disorder, without psychotic features (HCC) 09/06/2024   Alcohol-induced mood disorder (HCC) 09/06/2024   Schizoaffective disorder (HCC) 04/10/2021   Ingestion of substance    Hypomagnesemia    Hypothyroidism 04/09/2021   QT prolongation 04/09/2021   Drug overdose 04/08/2021   Essential hypertension 04/08/2021   Suicidal ideation 04/08/2021   Hypokalemia 04/08/2021   Alcohol abuse 04/08/2021   Major depressive disorder, single episode, severe (HCC) 04/08/2021   Alcohol use disorder, severe, dependence (HCC) 04/08/2021   Abnormal EKG    Alcoholic intoxication without complication

## 2024-09-09 NOTE — ED Notes (Signed)
 Pt is sleeping, no acute distress noted.

## 2024-09-09 NOTE — Group Note (Signed)
 Group Topic: Relaxation  Group Date: 09/09/2024 Start Time: 1430 End Time: 1500 Facilitators: Laneta Renea POUR, NT  Department: [facility]  Number of Participants: 7  Group Focus: problem solving and substance abuse education Treatment Modality:  Psychoeducation Interventions utilized were patient education and problem solving Purpose: enhance coping skills and increase insight  Name: Victoria Patel Date of Birth: 04-20-1966  MR: [id number]    Level of Participation: active Quality of Participation: motivated Interactions with others: gave feedback Mood/Affect: appropriate and positive Triggers (if applicable): None Cognition: coherent/clear and insightful Progress: Gaining insight Response:  I love this weather, would rather be at home.  Plan: patient will be encouraged to attend group.   Patients Problems:  Patient Active Problem List   Diagnosis Date Noted   Severe episode of recurrent major depressive disorder, without psychotic features (HCC) 09/06/2024   Alcohol-induced mood disorder (HCC) 09/06/2024   Schizoaffective disorder (HCC) 04/10/2021   Ingestion of substance    Hypomagnesemia    Hypothyroidism 04/09/2021   QT prolongation 04/09/2021   Drug overdose 04/08/2021   Essential hypertension 04/08/2021   Suicidal ideation 04/08/2021   Hypokalemia 04/08/2021   Alcohol abuse 04/08/2021   Major depressive disorder, single episode, severe (HCC) 04/08/2021   Alcohol use disorder, severe, dependence (HCC) 04/08/2021   Abnormal EKG    Alcoholic intoxication without complication

## 2024-09-09 NOTE — Care Management (Signed)
 [facility] Care Management...   Writer met with patient to discuss discharge planning...   Patient has been accepted to [facility]  Patient reported that she would prefer to do Out Patient treatment.  Writer provided patient the number for [facility]  Writer coordinated an SAIOP appointment for Friday 09/11/2024 @ 9:00 am at [facility]  Patient can discharge Thursday 09/10/2024 by 11:00 am  Patient will arrange transportation  Scripts

## 2024-09-09 NOTE — ED Notes (Signed)
 Patient alert, Mood appears calm. Respirations even and unlabored. No acute distress observed. Environment secured. Poc ongoing

## 2024-09-09 NOTE — ED Notes (Signed)
 Pt sleeping in bed, no acute distress noted. Respirations even and unlabored. Will continue to monitor for safety.

## 2024-09-10 DIAGNOSIS — F411 Generalized anxiety disorder: Secondary | ICD-10-CM

## 2024-09-10 DIAGNOSIS — F1094 Alcohol use, unspecified with alcohol-induced mood disorder: Secondary | ICD-10-CM

## 2024-09-10 LAB — T4, FREE: Free T4: 0.64 ng/dL (ref 0.61–1.12)

## 2024-09-10 MED ORDER — NALTREXONE HCL 50 MG PO TABS: 25.0000 mg | ORAL_TABLET | Freq: Every day | ORAL | Status: DC

## 2024-09-10 MED ORDER — NALTREXONE HCL 50 MG PO TABS: 25.0000 mg | ORAL_TABLET | Freq: Every day | ORAL | 0 refills | Status: AC

## 2024-09-10 MED ORDER — NALTREXONE HCL 50 MG PO TABS
25.0000 mg | ORAL_TABLET | Freq: Every day | ORAL | Status: DC
  Administered 2024-09-10: 25 mg via ORAL
  Filled 2024-09-10: qty 1

## 2024-09-10 MED ORDER — TRAZODONE HCL 100 MG PO TABS: 100.0000 mg | ORAL_TABLET | Freq: Every day | ORAL | 0 refills | Status: AC

## 2024-09-10 NOTE — ED Notes (Signed)
 Discharge instructions reviewed w/ pt. Medications, follow-up care and resources reviewed. Pt verbalized understanding. All belongings returned to pt. Pt A&Ox4, ambulatory w/ steady gait and VSS upon departure.

## 2024-09-10 NOTE — Group Note (Unsigned)
 Group Topic: Change and Accountability  Group Date: 09/09/2024 Start Time: 1900 End Time: 1930 Facilitators: Carollynn Genre, NT  Department: [facility]  Number of Participants: 7  Group Focus: coping skills Treatment Modality:  Cognitive Behavioral Therapy Interventions utilized were support Purpose: increase insight   Name: Victoria Patel Date of Birth: 1965-11-02  MR: [id number]    Level of Participation: {THERAPIES; PSYCH GROUP PARTICIPATION OZCZO:76008} Quality of Participation: {THERAPIES; PSYCH QUALITY OF PARTICIPATION:23992} Interactions with others: {THERAPIES; PSYCH INTERACTIONS:23993} Mood/Affect: {THERAPIES; PSYCH MOOD/AFFECT:23994} Triggers (if applicable): *** Cognition: {THERAPIES; PSYCH COGNITION:23995} Progress: {THERAPIES; PSYCH PROGRESS:23997} Response: *** Plan: {THERAPIES; PSYCH EOJW:76003}  Patients Problems:  Patient Active Problem List   Diagnosis Date Noted   Severe episode of recurrent major depressive disorder, without psychotic features (HCC) 09/06/2024   Alcohol-induced mood disorder (HCC) 09/06/2024   Schizoaffective disorder (HCC) 04/10/2021   Ingestion of substance    Hypomagnesemia    Hypothyroidism 04/09/2021   QT prolongation 04/09/2021   Drug overdose 04/08/2021   Essential hypertension 04/08/2021   Suicidal ideation 04/08/2021   Hypokalemia 04/08/2021   Alcohol abuse 04/08/2021   Major depressive disorder, single episode, severe (HCC) 04/08/2021   Alcohol use disorder, severe, dependence (HCC) 04/08/2021   Abnormal EKG    Alcoholic intoxication without complication

## 2024-09-10 NOTE — ED Notes (Signed)
 Paitent provided breakfast.

## 2024-09-10 NOTE — ED Provider Notes (Signed)
 [facility]/OBS ASAP Discharge Summary  Date and Time: 09/10/2024 3:57 PM  Name: Victoria Patel  MRN:  [id number]   Discharge Diagnoses:  Final diagnoses:  Alcohol-induced mood disorder with depressive symptoms (HCC)  GAD (generalized anxiety disorder)    Subjective: Victoria Patel 58 y.o., female patient presented to [name] ED under involuntary commitment with complaints of suicidal ideations and severe alcohol use.  Patient was accepted and transferred to [facility] facility-based crisis unit for detox and ongoing treatment.  Patient was IVC by [facility]. Victoria Patel, is seen face to face by this provider, consulted with Dr. Lawrnce; and chart reviewed on 09/10/24.    On evaluation Victoria Patel reports that she slept decent and has good appetite.  She reports that she has detox from alcohol before but that this time was a lot worse than usual as she was seeing butterflies and wanting to harm herself.  Today she reports feeling good and denies having withdrawal symptoms.  She denies suicidal ideation, homicidal ideation and hallucinations.  Patient reports that she is excited to return to work as she works at a [employer] and loves her job.  Patient states that she is currently attending an [employer] and has some options for sponsors. Pt does endorse being worried about starting to have cravings once discharged, discussed option to being Naltrexone this morning to make sure no side effects occur and continue with discharge. Pt agrees to this plan. Pt states that her husband will be picking her up around 11:00AM.   During evaluation Victoria Patel is sitting in dayroom, in no acute distress.  She is alert & oriented x 4, calm, cooperative and attentive for this assessment.  Her mood is euthymic with congruent affect.  She has normal speech, and behavior.  Objectively there is no evidence of psychosis/mania or delusional thinking. Pt does not appear to be responding to internal or external  stimuli.  Patient is able to converse coherently, goal directed thoughts, no distractibility, or pre-occupation.  She also denies current suicidal/self-harm/homicidal ideation, psychosis, and paranoia.  Patient answered assessment questions appropriately.     Stay Summary: Patient was initially IVC by [facility] where she was receiving treatment for alcohol abuse with started displaying bizarre behaviors including having hallucinations as well as making suicidal threats.  Patient was sent to [facility] emergency department on 09/06/2024 for medical clearance.  Patient was admitted to [facility] unit and transferred on 09/06/2024.  Patient was continued on her Ativan  taper that was started in the ED along with home medications.  Patient was started on naltrexone 25 mg daily for cravings and will be discharged with this medication.  IVC was rescinded yesterday.  Total Time spent with patient: 45 minutes  Past Psychiatric History: Anxiety, major depressive disorder, recurrent, severe, alcohol use disorder, alcohol-induced mood disorder, suicidal ideation Past Medical History: GERD, hypertension, hypothyroidism, hypokalemia, QT prolongation, hypomagnesemia Family History: None reported Family Psychiatric  History: None reported Social History: Resides with husband, employed, denies access to weapons, alcohol use disorder, early remission Tobacco Cessation:  A prescription for an FDA-approved tobacco cessation medication was offered at discharge and the patient refused  Current Medications:  Current Facility-Administered Medications  Medication Dose Route Frequency Provider Last Rate Last Admin   acetaminophen  (TYLENOL ) tablet 650 mg  650 mg Oral Q6H PRN Nkwenti, Doris, NP       alum & mag hydroxide-simeth (MAALOX/MYLANTA) 200-200-20 MG/5ML suspension 30 mL  30 mL Oral Q4H PRN Nkwenti,  Doris, NP   30 mL at 09/09/24 1953   benazepril (LOTENSIN) tablet 40 mg  40 mg Oral Daily Nkwenti, Doris,  NP   40 mg at 09/10/24 9068   And   amLODipine  (NORVASC ) tablet 5 mg  5 mg Oral Daily Nkwenti, Doris, NP   5 mg at 09/10/24 9068   cloNIDine  (CATAPRES ) tablet 0.1 mg  0.1 mg Oral BID PRN Tex Drilling, NP       hydrOXYzine  (ATARAX ) tablet 25 mg  25 mg Oral TID PRN Tex Drilling, NP       magnesium  hydroxide (MILK OF MAGNESIA) suspension 30 mL  30 mL Oral Daily PRN Tex Drilling, NP   30 mL at 09/08/24 0940   multivitamin with minerals tablet 1 tablet  1 tablet Oral Daily Tex Drilling, NP   1 tablet at 09/10/24 0931   naltrexone (DEPADE) tablet 25 mg  25 mg Oral Daily Gerardine Peltz C, NP   25 mg at 09/10/24 9071   nicotine (NICODERM CQ - dosed in mg/24 hours) patch 21 mg  21 mg Transdermal Q0600 Tex Drilling, NP       OLANZapine (ZYPREXA) injection 5 mg  5 mg Intramuscular TID PRN Tex Drilling, NP       OLANZapine zydis (ZYPREXA) disintegrating tablet 5 mg  5 mg Oral TID PRN Tex Drilling, NP       senna-docusate (Senokot-S) tablet 1 tablet  1 tablet Oral BID Tex Drilling, NP   1 tablet at 09/09/24 9060   thiamine  (VITAMIN B1) tablet 100 mg  100 mg Oral Daily Nkwenti, Doris, NP   100 mg at 09/10/24 9068   traZODone  (DESYREL ) tablet 50 mg  50 mg Oral QHS PRN Tex Drilling, NP   50 mg at 09/09/24 2135   Current Outpatient Medications  Medication Sig Dispense Refill   amLODipine -benazepril (LOTREL) 5-40 MG capsule Take 1 capsule by mouth daily.     atenolol  (TENORMIN ) 50 MG tablet Take 50 mg by mouth daily.     escitalopram  (LEXAPRO ) 20 MG tablet Take 20 mg by mouth every morning.     esomeprazole (NEXIUM) 20 MG capsule Take 20 mg by mouth every morning.     Multiple Vitamins-Minerals (MULTIVITAMIN ADULT, MINERALS,) TABS TAKE ONE TABLET DAILY AT 8AM     naltrexone (DEPADE) 50 MG tablet Take 0.5 tablets (25 mg total) by mouth daily. 30 tablet 0   ondansetron  (ZOFRAN -ODT) 8 MG disintegrating tablet Take 1 tablet (8 mg total) by mouth every 8 (eight) hours as needed for nausea or  vomiting. 12 tablet 0   OVER THE COUNTER MEDICATION Place 1 drop into both eyes daily as needed (dry eyes). Dry eye drop     rosuvastatin  (CRESTOR ) 40 MG tablet Take 40 mg by mouth daily.     thiamine  100 MG tablet Take 1 tablet (100 mg total) by mouth daily.     traZODone  (DESYREL ) 100 MG tablet Take 1 tablet (100 mg total) by mouth at bedtime. 30 tablet 0    PTA Medications:  PTA Medications  Medication Sig   atenolol  (TENORMIN ) 50 MG tablet Take 50 mg by mouth daily.   rosuvastatin  (CRESTOR ) 40 MG tablet Take 40 mg by mouth daily.   OVER THE COUNTER MEDICATION Place 1 drop into both eyes daily as needed (dry eyes). Dry eye drop   thiamine  100 MG tablet Take 1 tablet (100 mg total) by mouth daily.   ondansetron  (ZOFRAN -ODT) 8 MG disintegrating tablet Take 1 tablet (8 mg  total) by mouth every 8 (eight) hours as needed for nausea or vomiting.   amLODipine -benazepril (LOTREL) 5-40 MG capsule Take 1 capsule by mouth daily.   escitalopram  (LEXAPRO ) 20 MG tablet Take 20 mg by mouth every morning.   esomeprazole (NEXIUM) 20 MG capsule Take 20 mg by mouth every morning.   Multiple Vitamins-Minerals (MULTIVITAMIN ADULT, MINERALS,) TABS TAKE ONE TABLET DAILY AT 8AM   naltrexone (DEPADE) 50 MG tablet Take 0.5 tablets (25 mg total) by mouth daily.   traZODone  (DESYREL ) 100 MG tablet Take 1 tablet (100 mg total) by mouth at bedtime.   Facility Ordered Medications  Medication   acetaminophen  (TYLENOL ) tablet 650 mg   alum & mag hydroxide-simeth (MAALOX/MYLANTA) 200-200-20 MG/5ML suspension 30 mL   magnesium  hydroxide (MILK OF MAGNESIA) suspension 30 mL   OLANZapine (ZYPREXA) injection 5 mg   OLANZapine zydis (ZYPREXA) disintegrating tablet 5 mg   hydrOXYzine  (ATARAX ) tablet 25 mg   traZODone  (DESYREL ) tablet 50 mg   nicotine (NICODERM CQ - dosed in mg/24 hours) patch 21 mg   [COMPLETED] thiamine  (VITAMIN B1) injection 100 mg   thiamine  (VITAMIN B1) tablet 100 mg   multivitamin with minerals  tablet 1 tablet   [EXPIRED] LORazepam  (ATIVAN ) tablet 1 mg   [EXPIRED] hydrOXYzine  (ATARAX ) tablet 25 mg   [EXPIRED] loperamide  (IMODIUM ) capsule 2-4 mg   [COMPLETED] LORazepam  (ATIVAN ) tablet 1 mg   Followed by   [COMPLETED] LORazepam  (ATIVAN ) tablet 1 mg   Followed by   [COMPLETED] LORazepam  (ATIVAN ) tablet 1 mg   Followed by   [COMPLETED] LORazepam  (ATIVAN ) tablet 1 mg   benazepril (LOTENSIN) tablet 40 mg   And   amLODipine  (NORVASC ) tablet 5 mg   cloNIDine  (CATAPRES ) tablet 0.1 mg   [COMPLETED] cloNIDine  (CATAPRES ) tablet 0.1 mg   senna-docusate (Senokot-S) tablet 1 tablet   [COMPLETED] magnesium  citrate solution 1 Bottle   naltrexone (DEPADE) tablet 25 mg       09/10/2024    9:27 AM 09/09/2024    2:48 PM 09/07/2024    6:25 PM  Depression screen PHQ 2/9  Decreased Interest 0 1 0  Down, Depressed, Hopeless 0 1 0  PHQ - 2 Score 0 2 0  Altered sleeping 2 0   Tired, decreased energy 1 1   Change in appetite 0 0   Feeling bad or failure about yourself  0 0   Trouble concentrating 0 0   Moving slowly or fidgety/restless 0 0   Suicidal thoughts 0 0   PHQ-9 Score 3 3   Difficult doing work/chores Not difficult at all Not difficult at all     Flowsheet Row ED from 09/06/2024 in [facility] ED from 09/05/2024 in [facility] Emergency Department at [facility] ED from 09/04/2024 in [facility] Emergency Department at [facility]  C-SSRS RISK CATEGORY Low Risk High Risk Low Risk    Musculoskeletal  Strength & Muscle Tone: within normal limits Gait & Station: normal Patient leans: N/A  Psychiatric Specialty Exam  Presentation  General Appearance:  Appropriate for Environment; Casual  Eye Contact: Good  Speech: Clear and Coherent; Normal Rate  Speech Volume: Normal  Handedness: Right   Mood and Affect  Mood: Euthymic  Affect: Appropriate; Congruent   Thought Process  Thought Processes: Coherent; Goal  Directed; Linear  Descriptions of Associations:Intact  Orientation:Full (Time, Place and Person)  Thought Content:Logical; WDL  Diagnosis of Schizophrenia or Schizoaffective disorder in past: No data recorded   Hallucinations:Hallucinations:  None  Ideas of Reference:None  Suicidal Thoughts:Suicidal Thoughts: No  Homicidal Thoughts:Homicidal Thoughts: No   Sensorium  Memory: Immediate Good; Recent Fair  Judgment: Good  Insight: Good   Executive Functions  Concentration: Good  Attention Span: Good  Recall: Good  Fund of Knowledge: Good  Language: Good   Psychomotor Activity  Psychomotor Activity: Psychomotor Activity: Normal   Assets  Assets: Communication Skills; Desire for Improvement; Financial Resources/Insurance; Housing; Physical Health; Social Support; Vocational/Educational; Resilience   Sleep  Sleep: Sleep: Fair  Estimated Sleeping Duration (Last 24 Hours): 7.75-9.25 hours (Due to Daylight Saving Time, the durations displayed may not accurately represent documentation during the time change interval)  No data recorded  Physical Exam  Physical Exam Vitals and nursing note reviewed.  Constitutional:      Appearance: Normal appearance.  HENT:     Head: Normocephalic.     Nose: Nose normal.  Eyes:     Extraocular Movements: Extraocular movements intact.  Cardiovascular:     Rate and Rhythm: Normal rate.  Pulmonary:     Effort: Pulmonary effort is normal.  Musculoskeletal:        General: Normal range of motion.     Cervical back: Normal range of motion.  Neurological:     General: No focal deficit present.     Mental Status: She is alert and oriented to person, place, and time.    Review of Systems  Constitutional: Negative.   HENT: Negative.    Eyes: Negative.   Respiratory: Negative.    Cardiovascular: Negative.   Gastrointestinal: Negative.   Genitourinary: Negative.   Musculoskeletal: Negative.   Neurological:  Negative.   Endo/Heme/Allergies: Negative.   Psychiatric/Behavioral:  Positive for substance abuse.    Blood pressure 130/70, pulse 78, temperature 98.1 F (36.7 C), temperature source Oral, resp. rate 17, SpO2 100%. There is no height or weight on file to calculate BMI.  Demographic Factors:  Caucasian  Loss Factors: NA  Historical Factors: Impulsivity  Risk Reduction Factors:   Sense of responsibility to family, Employed, Living with another person, especially a relative, Positive social support, Positive therapeutic relationship, and Positive coping skills or problem solving skills  Continued Clinical Symptoms:  Alcohol/Substance Abuse/Dependencies  Cognitive Features That Contribute To Risk:  None    Suicide Risk:  Minimal: No identifiable suicidal ideation.  Patients presenting with no risk factors but with morbid ruminations; may be classified as minimal risk based on the severity of the depressive symptoms  Plan Of Care/Follow-up recommendations:  Patient discharged home from [facility] with plans to follow-up with [facility] program.  Patient has an appointment on Friday at 9:00 AM.  Patient denies current withdrawal symptoms and cravings.  She denies current suicidal ideations, homicidal ideations and hallucinations.  Patient reports looking forward to returning back to work and continuing with her sobriety during with the support of her husband.  Patient will continue with her home medications including Lotrel 5-40mg , atenolol  50 mg daily, atorvastatin 40 mg daily. Sent to pharmacy: Trazodone  100mg  nightly as needed for sleep and naltrexone 25 mg daily for alcohol cravings.  Labs evaluated: Patient encouraged to refrain from illicit drug use including marijuana.  Also discussed patient's history of hypothyroidism as her TSH levels were elevated at 6.723, she will need to follow-up with her PCP to further examine these findings.  Disposition: Discharge home,  follow up with [facility].   Alan JAYSON Mcardle, NP 09/10/2024, 3:58 PM

## 2024-09-11 LAB — T3, FREE: T3, Free: 3.7 pg/mL (ref 2.0–4.4)
